# Patient Record
Sex: Male | Born: 1960 | Race: Black or African American | Hispanic: No | Marital: Single | Smoking: Current some day smoker
Health system: Southern US, Community
[De-identification: ages and names within clinical notes are randomized; demographics above are authoritative.]

## PROBLEM LIST (undated history)

## (undated) DIAGNOSIS — I10 Essential (primary) hypertension: Secondary | ICD-10-CM

---

## 2003-06-12 ENCOUNTER — Emergency Department (HOSPITAL_COMMUNITY): Admission: EM | Admit: 2003-06-12 | Discharge: 2003-06-12 | Payer: Self-pay | Admitting: Emergency Medicine

## 2004-09-15 ENCOUNTER — Emergency Department (HOSPITAL_COMMUNITY): Admission: EM | Admit: 2004-09-15 | Discharge: 2004-09-15 | Payer: Self-pay | Admitting: Emergency Medicine

## 2004-12-09 ENCOUNTER — Emergency Department (HOSPITAL_COMMUNITY): Admission: EM | Admit: 2004-12-09 | Discharge: 2004-12-09 | Payer: Self-pay | Admitting: Emergency Medicine

## 2004-12-12 ENCOUNTER — Emergency Department (HOSPITAL_COMMUNITY): Admission: EM | Admit: 2004-12-12 | Discharge: 2004-12-12 | Payer: Self-pay | Admitting: Emergency Medicine

## 2005-01-02 ENCOUNTER — Emergency Department (HOSPITAL_COMMUNITY): Admission: EM | Admit: 2005-01-02 | Discharge: 2005-01-02 | Payer: Self-pay | Admitting: Emergency Medicine

## 2005-07-05 ENCOUNTER — Emergency Department (HOSPITAL_COMMUNITY): Admission: EM | Admit: 2005-07-05 | Discharge: 2005-07-05 | Payer: Self-pay | Admitting: Emergency Medicine

## 2012-06-05 ENCOUNTER — Emergency Department (HOSPITAL_COMMUNITY): Payer: Self-pay

## 2012-06-05 ENCOUNTER — Encounter (HOSPITAL_COMMUNITY): Payer: Self-pay | Admitting: Emergency Medicine

## 2012-06-05 ENCOUNTER — Emergency Department (HOSPITAL_COMMUNITY)
Admission: EM | Admit: 2012-06-05 | Discharge: 2012-06-05 | Disposition: A | Discharge status: Home / Self Care | Payer: Self-pay | Attending: Emergency Medicine | Admitting: Emergency Medicine

## 2012-06-05 DIAGNOSIS — S060X9A Concussion with loss of consciousness of unspecified duration, initial encounter: Secondary | ICD-10-CM | POA: Insufficient documentation

## 2012-06-05 DIAGNOSIS — Y9389 Activity, other specified: Secondary | ICD-10-CM | POA: Insufficient documentation

## 2012-06-05 DIAGNOSIS — M25512 Pain in left shoulder: Secondary | ICD-10-CM

## 2012-06-05 DIAGNOSIS — S0993XA Unspecified injury of face, initial encounter: Secondary | ICD-10-CM | POA: Insufficient documentation

## 2012-06-05 DIAGNOSIS — Y929 Unspecified place or not applicable: Secondary | ICD-10-CM | POA: Insufficient documentation

## 2012-06-05 DIAGNOSIS — S199XXA Unspecified injury of neck, initial encounter: Secondary | ICD-10-CM | POA: Insufficient documentation

## 2012-06-05 DIAGNOSIS — F17200 Nicotine dependence, unspecified, uncomplicated: Secondary | ICD-10-CM | POA: Insufficient documentation

## 2012-06-05 DIAGNOSIS — S0083XA Contusion of other part of head, initial encounter: Secondary | ICD-10-CM | POA: Insufficient documentation

## 2012-06-05 DIAGNOSIS — S0003XA Contusion of scalp, initial encounter: Secondary | ICD-10-CM | POA: Insufficient documentation

## 2012-06-05 DIAGNOSIS — W11XXXA Fall on and from ladder, initial encounter: Secondary | ICD-10-CM | POA: Insufficient documentation

## 2012-06-05 DIAGNOSIS — S46009A Unspecified injury of muscle(s) and tendon(s) of the rotator cuff of unspecified shoulder, initial encounter: Secondary | ICD-10-CM

## 2012-06-05 DIAGNOSIS — S0093XA Contusion of unspecified part of head, initial encounter: Secondary | ICD-10-CM

## 2012-06-05 MED ORDER — HYDROMORPHONE HCL PF 1 MG/ML IJ SOLN
1.0000 mg | Freq: Once | INTRAMUSCULAR | Status: AC
  Administered 2012-06-05: 1 mg via INTRAVENOUS
  Filled 2012-06-05: qty 1

## 2012-06-05 MED ORDER — HYDROCODONE-ACETAMINOPHEN 5-325 MG PO TABS: ORAL_TABLET | ORAL | Status: DC

## 2012-06-05 MED ORDER — ONDANSETRON HCL 4 MG/2ML IJ SOLN
4.0000 mg | Freq: Once | INTRAMUSCULAR | Status: AC
  Administered 2012-06-05: 4 mg via INTRAVENOUS
  Filled 2012-06-05: qty 2

## 2012-06-05 MED ORDER — CYCLOBENZAPRINE HCL 5 MG PO TABS: 5.0000 mg | ORAL_TABLET | Freq: Three times a day (TID) | ORAL | Status: DC | PRN

## 2012-06-05 NOTE — ED Notes (Signed)
Patient would like some water at this time. RN made aware. 

## 2012-06-05 NOTE — ED Notes (Signed)
Pt fell aprox 8 steps from ladder. Pt states did have LOC. And c/o L shoulder pain. L shoulder obviously deformed. Denies n/v or h/a since fall but has had some dizziness. Radial pulses strong.

## 2012-06-05 NOTE — ED Provider Notes (Signed)
History   This chart was scribed for Ward Givens, MD by Charolett Bumpers, ED Scribe. The patient was seen in room APA02/APA02. Patient's care was started at 1135.   CSN: [id number]  Arrival date & time 06/05/12  1100   First MD Initiated Contact with Patient 06/05/12 1135      Chief Complaint  Patient presents with  . Shoulder Pain  . Loss of Consciousness   Blake Valentine is a 51 y.o. male who presents to the Emergency Department complaining of left shoulder pain with an obvious deformity after falling approximately 8 steps from a ladder PTA. He was carrying shingles up a ladder and lost his balance and fell.He states that he twisted while falling, and landed on his left shoulder. He reports he hit his head and experienced LOC for uncertain length of time. He states that he has some confusion temporarily while he first came to. He reports an associated occipital headache and intermittent numbness in his left fingers. He denies any nausea, vomiting,chest or rib pain,abdominal pain, back pain lower extremity pain. He denies any other injuries or complaints of pain. He states that he was able to ambulate afterwards. He denies any regular medications and states his is normally healthy.   Patient is a 51 y.o. male presenting with shoulder pain. The history is provided by the patient. No language interpreter was used.  Shoulder Pain This is a new problem. The current episode started 1 to 2 hours ago. The problem occurs constantly. The problem has not changed since onset.Associated symptoms include headaches. Pertinent negatives include no chest pain. He has tried nothing for the symptoms.   No PCP  History reviewed. No pertinent past medical history.  History reviewed. No pertinent past surgical history.  History reviewed. No pertinent family history.    History  Substance Use Topics  . Smoking status: Current Every Day Smoker  . Smokeless tobacco: Not on file  . Alcohol Use: No    Employed as a roofer   Review of Systems  HENT: Negative for neck pain.   Cardiovascular: Negative for chest pain.  Gastrointestinal: Negative for nausea and vomiting.  Musculoskeletal: Positive for arthralgias.       Left shoulder pain.   Neurological: Positive for headaches.  All other systems reviewed and are negative.    Allergies  Review of patient's allergies indicates no known allergies.  Home Medications  None  BP 112/65  Pulse 81  Temp 98.1 F (36.7 C) (Oral)  Resp 16  Ht 5\' 6"  (1.676 m)  Wt 150 lb (68.04 kg)  BMI 24.21 kg/m2  SpO2 98%  Vital signs normal    Physical Exam  Nursing note and vitals reviewed. Constitutional: He is oriented to person, place, and time. He appears well-developed and well-nourished. No distress.  HENT:  Head: Normocephalic and atraumatic.  Right Ear: External ear normal.  Left Ear: External ear normal.  Nose: Nose normal.  Mouth/Throat: Oropharynx is clear and moist.       Tender over the occipital scalp.   Eyes: Conjunctivae normal and EOM are normal. Pupils are equal, round, and reactive to light.  Neck: Normal range of motion. Neck supple. No tracheal deviation present.       Patient placed in c-collar, has some tenderness in his left paraspinous muscles when patient moves his head.  Cardiovascular: Normal rate and regular rhythm.  Exam reveals no gallop and no friction rub.   No murmur heard. Pulmonary/Chest: Effort normal and  breath sounds normal. No respiratory distress. He has no wheezes. He has no rales. He exhibits no tenderness.       nontender ribs to stressing  Abdominal: Soft. Bowel sounds are normal. He exhibits no distension and no mass. There is no tenderness. There is no rebound and no guarding.       No tenderness to palpation  Musculoskeletal: Normal range of motion. He exhibits no edema.       Patient is nontender to palpation over his left clavicle. He appears to have deformity with step off in his left  shoulder area. That is also where his pain is located. He has good distal sensation and has good range of motion of his fingers. He has good distal pulses. He has good range of motion of his lower extremities without pain on  flexion of the hips or knees.  Neurological: He is alert and oriented to person, place, and time.  Skin: Skin is warm and dry.  Psychiatric: He has a normal mood and affect. His behavior is normal.    ED Course  Procedures (including critical care time)   Medications  HYDROmorphone (DILAUDID) injection 1 mg (1 mg Intravenous Given 06/05/12 1205)  ondansetron (ZOFRAN) injection 4 mg (4 mg Intravenous Given 06/05/12 1205)     DIAGNOSTIC STUDIES: Oxygen Saturation is 98% on room air, normal by my interpretation.    COORDINATION OF CARE:  11:55-Discussed planned course of treatment with the patient including pain medication, an x-ray of his left shoulder and a head CT and c-spine, who is agreeable at this time.   14:15 pt states he injured his left shoulder about 4 years ago and was told it was dislocated but not broken. Still unable to abduct his left arm. Pt placed in sling for possible roatator cuff injury.   Ct Head Wo Contrast  06/05/2012  *RADIOLOGY REPORT*  Clinical Data: Fall from ladder  CT HEAD WITHOUT CONTRAST,CT CERVICAL SPINE WITHOUT CONTRAST  Technique:  Contiguous axial images were obtained from the base of the skull through the vertex without contrast.,Technique: Multidetector CT imaging of the cervical spine was performed. Multiplanar CT image reconstructions were also generated.  Comparison: None.  Findings: No skull fracture is noted.  Paranasal sinuses and mastoid air cells are unremarkable.  No intracranial hemorrhage, mass effect or midline shift.  No hydrocephalus.  No acute infarction.  No mass lesion is noted on this unenhanced scan.  The gray and white matter differentiation is preserved.  IMPRESSION: No acute intracranial abnormality.  CT cervical  spine without IV contrast:  Axial images of the cervical spine shows no acute fracture or subluxation.  Computer processed images shows no acute fracture or subluxation.  There is no pneumothorax in visualized lung apices.  Mild anterior spurring noted lower endplate of C5 vertebral body. Mild disc bulge noted at C5-C6 level.  Mild disc space flattening with disc bulge at C4-C5 level.  There is calcification of the anterior longitudinal ligament at C5-C6 level.  No prevertebral soft tissue swelling.  Spinal canal is patent.  Impression: 1.  No acute fracture or subluxation.  Mild degenerative changes as described above.   Original Report Authenticated By: Natasha Mead, M.D.    Ct Cervical Spine Wo Contrast  06/05/2012  *RADIOLOGY REPORT*  Clinical Data: Fall from ladder  CT HEAD WITHOUT CONTRAST,CT CERVICAL SPINE WITHOUT CONTRAST  Technique:  Contiguous axial images were obtained from the base of the skull through the vertex without contrast.,Technique: Multidetector CT imaging of  the cervical spine was performed. Multiplanar CT image reconstructions were also generated.  Comparison: None.  Findings: No skull fracture is noted.  Paranasal sinuses and mastoid air cells are unremarkable.  No intracranial hemorrhage, mass effect or midline shift.  No hydrocephalus.  No acute infarction.  No mass lesion is noted on this unenhanced scan.  The gray and white matter differentiation is preserved.  IMPRESSION: No acute intracranial abnormality.  CT cervical spine without IV contrast:  Axial images of the cervical spine shows no acute fracture or subluxation.  Computer processed images shows no acute fracture or subluxation.  There is no pneumothorax in visualized lung apices.  Mild anterior spurring noted lower endplate of C5 vertebral body. Mild disc bulge noted at C5-C6 level.  Mild disc space flattening with disc bulge at C4-C5 level.  There is calcification of the anterior longitudinal ligament at C5-C6 level.  No  prevertebral soft tissue swelling.  Spinal canal is patent.  Impression: 1.  No acute fracture or subluxation.  Mild degenerative changes as described above.   Original Report Authenticated By: Natasha Mead, M.D.    Dg Shoulder Left  06/05/2012  *RADIOLOGY REPORT*  Clinical Data: Left shoulder pain, fall from ladder  LEFT SHOULDER - 2+ VIEW  Comparison: None  Findings: Abnormal alignment of left AC joint with superior subluxation of left clavicular head. Associated periosteal new bone along the caudal margin of the distal clavicle. These changes are likely the result of prior trauma, question AC separation with periosteal stripping versus prior distal left clavicular fracture. No acute fracture, dislocation, or bone destruction. Bone mineralization is normal. Visualized left ribs intact.  IMPRESSION: Post-traumatic deformities of the distal left clavicle and left AC joint. No definite acute bony abnormalities.   Original Report Authenticated By: Ulyses Southward, M.D.      1. Accidental fall from ladder   2. Concussion   3. Shoulder pain, left   4. Rotator cuff injury   5. Contusion of head    New Prescriptions   CYCLOBENZAPRINE (FLEXERIL) 5 MG TABLET    Take 1 tablet (5 mg total) by mouth 3 (three) times daily as needed for muscle spasms.   HYDROCODONE-ACETAMINOPHEN (NORCO/VICODIN) 5-325 MG PER TABLET    Take 1 or 2 po Q 6hrs for pain    Plan discharge  Devoria Albe, MD, FACEP    MDM    I personally performed the services described in this documentation, which was scribed in my presence. The recorded information has been reviewed and considered.  Devoria Albe, MD, Armando Gang      Ward Givens, MD 06/05/12 [phone]

## 2012-06-05 NOTE — ED Notes (Signed)
Patient given water per MD approval. 

## 2012-06-12 ENCOUNTER — Encounter (HOSPITAL_COMMUNITY): Payer: Self-pay | Admitting: Emergency Medicine

## 2012-06-12 ENCOUNTER — Telehealth: Payer: Self-pay | Admitting: Orthopedic Surgery

## 2012-06-12 ENCOUNTER — Emergency Department (HOSPITAL_COMMUNITY)
Admission: EM | Admit: 2012-06-12 | Discharge: 2012-06-12 | Disposition: A | Discharge status: Home / Self Care | Payer: Self-pay | Attending: Emergency Medicine | Admitting: Emergency Medicine

## 2012-06-12 DIAGNOSIS — F17200 Nicotine dependence, unspecified, uncomplicated: Secondary | ICD-10-CM | POA: Insufficient documentation

## 2012-06-12 DIAGNOSIS — M25519 Pain in unspecified shoulder: Secondary | ICD-10-CM | POA: Insufficient documentation

## 2012-06-12 MED ORDER — OXYCODONE-ACETAMINOPHEN 5-325 MG PO TABS: 1.0000 | ORAL_TABLET | ORAL | Status: DC | PRN

## 2012-06-12 MED ORDER — OXYCODONE-ACETAMINOPHEN 5-325 MG PO TABS
2.0000 | ORAL_TABLET | Freq: Once | ORAL | Status: AC
  Administered 2012-06-12: 2 via ORAL
  Filled 2012-06-12: qty 2

## 2012-06-12 NOTE — ED Notes (Signed)
Pt here for continued shoulder pain since 06/05/12. Pt seen and treated in ED after falling from roof. Pt states he could not get an appointment with Dr. Romeo Apple.

## 2012-06-12 NOTE — Telephone Encounter (Signed)
Patient called back; states already had been back to Emergency room again.  Advised we had been trying to reach him. Appointment scheduled for next available date.

## 2012-06-12 NOTE — Telephone Encounter (Addendum)
Patient had called initially on 06/06/12 following visit to [facility]  Emergency Room on 06/05/12, regarding shoulder injury (dislocation) related to a fall.  Per note submitted to Dr. Romeo Apple, he has reviewed the patient's notes and films, and has recommended follow up appointment here, Self-pay policy applies, as initially explained to patient 06/06/12.   I called back to patient today 06/12/12 to follow up, as no call back has been received, ph# [phone].  No answer or answer machine. Tried cell # [phone], and it is a non-working #.

## 2012-06-15 NOTE — ED Provider Notes (Signed)
History     CSN: [id number]  Arrival date & time 06/12/12  1122   First MD Initiated Contact with Patient 06/12/12 1245      Chief Complaint  Patient presents with  . Shoulder Pain    (Consider location/radiation/quality/duration/timing/severity/associated sxs/prior treatment) HPI Comments: Blake Valentine presents for continued pain management for injury sustained to his left shoulder after falling off a roof 1 week ago.  He was seen here for this injury the day of the event at which time his xrays showed no new injury,  Yet he continues to have pain,  Despite resting with use of a sling.  He is awaiting further evaluation of his shoulder by Dr. Romeo Apple and is waiting a call back for an appointment time.  He denies numbness or weakness in his hand. He also denies shortness of breath and chest pain. He took his last pain pill yesterday.  The history is provided by the patient.    History reviewed. No pertinent past medical history.  History reviewed. No pertinent past surgical history.  No family history on file.  History  Substance Use Topics  . Smoking status: Current Every Day Smoker  . Smokeless tobacco: Not on file  . Alcohol Use: No      Review of Systems  Musculoskeletal: Positive for arthralgias.  Skin: Negative for wound.  Neurological: Negative for weakness and numbness.    Allergies  Review of patient's allergies indicates no known allergies.  Home Medications   Current Outpatient Rx  Name  Route  Sig  Dispense  Refill  . CYCLOBENZAPRINE HCL 5 MG PO TABS   Oral   Take 1 tablet (5 mg total) by mouth 3 (three) times daily as needed for muscle spasms.   30 tablet   0   . HYDROCODONE-ACETAMINOPHEN 5-325 MG PO TABS      Take 1 or 2 po Q 6hrs for pain   12 tablet   0   . OXYCODONE-ACETAMINOPHEN 5-325 MG PO TABS   Oral   Take 1 tablet by mouth every 4 (four) hours as needed for pain.   24 tablet   0     BP 128/93  Pulse 82  Temp 98.4 F  (36.9 C) (Oral)  Resp 20  Ht 5\' 6"  (1.676 m)  Wt 150 lb (68.04 kg)  BMI 24.21 kg/m2  SpO2 98%  Physical Exam  Constitutional: He appears well-developed and well-nourished.  HENT:  Head: Atraumatic.  Neck: Normal range of motion.  Cardiovascular:       Pulses equal bilaterally  Musculoskeletal: He exhibits tenderness.       Left shoulder: He exhibits bony tenderness and deformity.       Palpable step off noted at left [facility] joint.  Equal grip strength. Distal sensation intact in bilateral arms and hands.  Radial pulses equal.  No pain with flexion of left wrist or elbow.  Neurological: He is alert. He has normal strength. He displays normal reflexes. No sensory deficit.       Equal strength  Skin: Skin is warm and dry.  Psychiatric: He has a normal mood and affect.    ED Course  Procedures (including critical care time)  Labs Reviewed - No data to display No results found.   1. Shoulder pain, acute       MDM  Pt advised that Dr. Diamantina [facility] office had tried to call him, apparently have incorrect phone # - advised him to contact them to schedule appt.  Pt understands.  Oxycodone prescribed.  Advised to continue using sling.        Blake Amor, PA 06/15/12 1121

## 2012-06-15 NOTE — ED Provider Notes (Signed)
Medical screening examination/treatment/procedure(s) were performed by non-physician practitioner and as supervising physician I was immediately available for consultation/collaboration.   [name] M Hallel Denherder, DO 06/15/12 1310 

## 2012-06-19 ENCOUNTER — Encounter: Payer: Self-pay | Admitting: Orthopedic Surgery

## 2012-06-19 ENCOUNTER — Ambulatory Visit (INDEPENDENT_AMBULATORY_CARE_PROVIDER_SITE_OTHER): Payer: Self-pay | Admitting: Orthopedic Surgery

## 2012-06-19 VITALS — BP 110/74 | Ht 66.0 in | Wt 162.6 lb

## 2012-06-19 DIAGNOSIS — S43109A Unspecified dislocation of unspecified acromioclavicular joint, initial encounter: Secondary | ICD-10-CM | POA: Insufficient documentation

## 2012-06-19 MED ORDER — OXYCODONE-ACETAMINOPHEN 5-325 MG PO TABS: 1.0000 | ORAL_TABLET | ORAL | Status: AC | PRN

## 2012-06-19 NOTE — Patient Instructions (Addendum)
Wear sling for 3 weeks   Remove to bathe   Limit elevation x 3 weeks  Take medication  As ordered

## 2012-06-20 ENCOUNTER — Encounter: Payer: Self-pay | Admitting: Orthopedic Surgery

## 2012-06-20 NOTE — Progress Notes (Signed)
Patient ID: Blake Valentine, male   DOB: 15-Jan-1961, 51 y.o.   MRN: [id number] Chief Complaint  Patient presents with  . Shoulder Pain    Rotator cuff pain d/t injury 06/09/12      This is a 51 year old male previous shoulder injury many years ago with the a.c. joint calcification which developed after a.c. separation presents after falling off of a height on December 11 injuring the left shoulder again there was question of whether she reentered the a.c. joint or had a rotator cuff injury.  He is now complaining of sharp dull throbbing burning pain which is rated 10 out of 10 and constant. It occurs morning and night is better with pain medication of Percocet but is worse at night and has some numbness and tingling. Apparently the roof was 2 stories high and when he fell he fell onto the shoulder. The system review is consistent with the chills and joint pain with the numbness and tingling as described no other complaints  Medical history recorded and reviewed  BP 110/74  Ht 5\' 6"  (1.676 m)  Wt 162 lb 9.6 oz (73.755 kg)  BMI 26.24 kg/m2   Examination is notable for tenderness over the a.c. joint with prominence of the a.c. joint which is chronic he has decreased range of motion in the shoulder but the shoulder joint itself is stable his decreased rotator cuff strength I think this is pain related skin is otherwise intact he has good pulses no lymphadenopathy normal distal sensation no pathologic reflexes normal balance normal gait. His mood and affect are normal joint x3 general appearance is also normal  A.c. joint injury. Recommend sling for 3 weeks pain medicine for 3 weeks if he's not better after 6 weeks then recommend followup evaluation but he is a self-pay patient and we have both agreed to try to cross so if he is better after a few weeks then he will need to come back. 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

## 2015-04-29 ENCOUNTER — Encounter (HOSPITAL_COMMUNITY): Payer: Self-pay | Admitting: Emergency Medicine

## 2015-04-29 ENCOUNTER — Emergency Department (HOSPITAL_COMMUNITY)
Admission: EM | Admit: 2015-04-29 | Discharge: 2015-04-29 | Disposition: A | Discharge status: Home / Self Care | Payer: Self-pay | Attending: Emergency Medicine | Admitting: Emergency Medicine

## 2015-04-29 ENCOUNTER — Emergency Department (HOSPITAL_COMMUNITY): Payer: Self-pay

## 2015-04-29 DIAGNOSIS — R197 Diarrhea, unspecified: Secondary | ICD-10-CM | POA: Insufficient documentation

## 2015-04-29 DIAGNOSIS — Z720 Tobacco use: Secondary | ICD-10-CM | POA: Insufficient documentation

## 2015-04-29 DIAGNOSIS — R1031 Right lower quadrant pain: Secondary | ICD-10-CM | POA: Insufficient documentation

## 2015-04-29 DIAGNOSIS — R109 Unspecified abdominal pain: Secondary | ICD-10-CM

## 2015-04-29 DIAGNOSIS — R112 Nausea with vomiting, unspecified: Secondary | ICD-10-CM | POA: Insufficient documentation

## 2015-04-29 DIAGNOSIS — R1011 Right upper quadrant pain: Secondary | ICD-10-CM | POA: Insufficient documentation

## 2015-04-29 LAB — COMPREHENSIVE METABOLIC PANEL
ALT: 32 U/L (ref 17–63)
AST: 23 U/L (ref 15–41)
Albumin: 3.8 g/dL (ref 3.5–5.0)
Alkaline Phosphatase: 53 U/L (ref 38–126)
Anion gap: 6 (ref 5–15)
BUN: 12 mg/dL (ref 6–20)
CO2: 26 mmol/L (ref 22–32)
Calcium: 8.9 mg/dL (ref 8.9–10.3)
Chloride: 107 mmol/L (ref 101–111)
Creatinine, Ser: 0.95 mg/dL (ref 0.61–1.24)
GFR calc Af Amer: >60 mL/min (ref >60)
GFR calc non Af Amer: >60 mL/min (ref >60)
Glucose, Bld: 86 mg/dL (ref 65–99)
Potassium: 3.4 mmol/L — ABNORMAL LOW (ref 3.5–5.1)
Sodium: 139 mmol/L (ref 135–145)
Total Bilirubin: 0.9 mg/dL (ref 0.3–1.2)
Total Protein: 7.4 g/dL (ref 6.5–8.1)

## 2015-04-29 LAB — URINALYSIS, ROUTINE W REFLEX MICROSCOPIC
Bilirubin Urine: NEGATIVE
Glucose, UA: NEGATIVE mg/dL
Ketones, ur: NEGATIVE mg/dL
Leukocytes, UA: NEGATIVE
Nitrite: NEGATIVE
Protein, ur: NEGATIVE mg/dL
Specific Gravity, Urine: 1.015 (ref 1.005–1.030)
Urobilinogen, UA: 0.2 mg/dL (ref 0.0–1.0)
pH: 6 (ref 5.0–8.0)

## 2015-04-29 LAB — URINE MICROSCOPIC-ADD ON

## 2015-04-29 LAB — CBC
HCT: 42.3 % (ref 39.0–52.0)
Hemoglobin: 14.6 g/dL (ref 13.0–17.0)
MCH: 31.2 pg (ref 26.0–34.0)
MCHC: 34.5 g/dL (ref 30.0–36.0)
MCV: 90.4 fL (ref 78.0–100.0)
Platelets: 256 10*3/uL (ref 150–400)
RBC: 4.68 MIL/uL (ref 4.22–5.81)
RDW: 14 % (ref 11.5–15.5)
WBC: 9.5 10*3/uL (ref 4.0–10.5)

## 2015-04-29 LAB — LIPASE, BLOOD: Lipase: 26 U/L (ref 11–51)

## 2015-04-29 MED ORDER — TRAMADOL HCL 50 MG PO TABS: 50.0000 mg | ORAL_TABLET | Freq: Four times a day (QID) | ORAL | Status: DC | PRN

## 2015-04-29 MED ORDER — SODIUM CHLORIDE 0.9 % IV BOLUS (SEPSIS)
1000.0000 mL | Freq: Once | INTRAVENOUS | Status: AC
  Administered 2015-04-29: 1000 mL via INTRAVENOUS

## 2015-04-29 MED ORDER — IOHEXOL 300 MG/ML  SOLN
50.0000 mL | Freq: Once | INTRAMUSCULAR | Status: AC | PRN
  Administered 2015-04-29: 50 mL via ORAL

## 2015-04-29 MED ORDER — KETOROLAC TROMETHAMINE 30 MG/ML IJ SOLN
15.0000 mg | Freq: Once | INTRAMUSCULAR | Status: AC
  Administered 2015-04-29: 15 mg via INTRAVENOUS
  Filled 2015-04-29: qty 1

## 2015-04-29 MED ORDER — IOHEXOL 300 MG/ML  SOLN
100.0000 mL | Freq: Once | INTRAMUSCULAR | Status: AC | PRN
  Administered 2015-04-29: 100 mL via INTRAVENOUS

## 2015-04-29 MED ORDER — ONDANSETRON HCL 4 MG/2ML IJ SOLN
4.0000 mg | Freq: Once | INTRAMUSCULAR | Status: AC
  Administered 2015-04-29: 4 mg via INTRAVENOUS
  Filled 2015-04-29: qty 2

## 2015-04-29 MED ORDER — HYDROMORPHONE HCL 1 MG/ML IJ SOLN
1.0000 mg | Freq: Once | INTRAMUSCULAR | Status: AC
  Administered 2015-04-29: 1 mg via INTRAVENOUS
  Filled 2015-04-29: qty 1

## 2015-04-29 NOTE — Discharge Instructions (Signed)

## 2015-04-29 NOTE — ED Notes (Signed)
Pt c/o of LLQ abdominal pain, n/v/d x 3 days.

## 2015-04-29 NOTE — ED Provider Notes (Signed)
CSN: [id number]     Arrival date & time 04/29/15  1118 History  By signing my name below, I, Blake Valentine, attest that this documentation has been prepared under the direction and in the presence of Raeford RazorStephen Jerrin Recore, MD. Electronically Signed: Angelene GiovanniEmmanuella Valentine, ED Scribe. 04/29/2015. 11:51 AM.    Chief Complaint  Patient presents with  . Abdominal Pain   Patient is a 54 y.o. male presenting with abdominal pain. The history is provided by the patient. No language interpreter was used.  Abdominal Pain Pain location:  RUQ and RLQ Pain quality: burning and sharp   Pain radiates to:  Back Pain severity:  Moderate Onset quality:  Gradual Duration:  3 days Timing:  Constant Progression:  Worsening Chronicity:  New Relieved by:  Nothing Worsened by:  Movement Ineffective treatments:  NSAIDs Associated symptoms: diarrhea, nausea and vomiting   Associated symptoms: no hematuria    HPI Comments: Mendel CorningWendell V Higdon is a 54 y.o. male who presents to the Emergency Department complaining of gradually worsening moderately sharp burning constant right abdominal pain that intermittently radiates to his right back worse with movement onset 3 days ago. He reports associated nausea, vomiting and diarrhea. He denies any blood in stool or hematuria. He reports that he took Ibuprofen with no relief. He denies a hx of abdominal surgeries. He reports NKDA.   History reviewed. No pertinent past medical history. History reviewed. No pertinent past surgical history. No family history on file. Social History  Substance Use Topics  . Smoking status: Current Every Day Smoker -- 1.00 packs/day    Types: Cigarettes  . Smokeless tobacco: None  . Alcohol Use: Yes     Comment: occ    Review of Systems  Gastrointestinal: Positive for nausea, vomiting, abdominal pain and diarrhea. Negative for blood in stool.  Genitourinary: Negative for hematuria.  All other systems reviewed and are  negative.     Allergies  Review of patient's allergies indicates no known allergies.  Home Medications   Prior to Admission medications   Not on File   BP 155/93 mmHg  Pulse 66  Temp(Src) 97.5 F (36.4 C) (Oral)  Resp 16  Ht 5\' 7"  (1.702 m)  Wt 180 lb (81.647 kg)  BMI 28.19 kg/m2  SpO2 98% Physical Exam  Constitutional: He is oriented to person, place, and time. He appears well-developed and well-nourished.  HENT:  Head: Normocephalic and atraumatic.  Eyes: EOM are normal.  Neck: Normal range of motion.  Cardiovascular: Normal rate, regular rhythm, normal heart sounds and intact distal pulses.   Pulmonary/Chest: Effort normal and breath sounds normal. No respiratory distress.  Abdominal: Soft. He exhibits no distension. There is tenderness. There is guarding. There is no rebound.  Tender in RUQ and RLQ with voluntary guarding in RUQ/RLQ  Musculoskeletal: Normal range of motion.  Neurological: He is alert and oriented to person, place, and time.  Skin: Skin is warm and dry.  Psychiatric: He has a normal mood and affect. Judgment normal.  Nursing note and vitals reviewed.   ED Course  Procedures (including critical care time) DIAGNOSTIC STUDIES: Oxygen Saturation is 96% on RA, adequate by my interpretation.    COORDINATION OF CARE: 11:46 AM - Pt advised of plan for treatment and pt agrees. Pt will receive CAT scan to rule out appendicitis.    Labs Review Labs Reviewed  COMPREHENSIVE METABOLIC PANEL - Abnormal; Notable for the following:    Potassium 3.4 (*)    All other components within normal  limits  URINALYSIS, ROUTINE W REFLEX MICROSCOPIC (NOT AT [facility]) - Abnormal; Notable for the following:    Hgb urine dipstick TRACE (*)    All other components within normal limits  LIPASE, BLOOD  CBC  URINE MICROSCOPIC-ADD ON    Imaging Review No results found.   Ct Abdomen Pelvis W Contrast  05/07/2015  CLINICAL DATA:  Right lower quadrant abdominal pain, diarrhea  and dark stools for the past 3 days. EXAM: CT ABDOMEN AND PELVIS WITH CONTRAST TECHNIQUE: Multidetector CT imaging of the abdomen and pelvis was performed using the standard protocol following bolus administration of intravenous contrast. CONTRAST:  OMNIPAQUE IOHEXOL 300 MG/ML  SOLN COMPARISON:  04/29/2015. FINDINGS: Small cysts in the liver are unchanged. The spleen, pancreas, gallbladder, adrenal glands, kidneys, ureters, urinary bladder and prostate gland appear normal. No gastrointestinal abnormalities or enlarged lymph nodes. Normal appearing appendix. Small amount of linear atelectasis or scarring at both lung bases. Mild dextroconvex thoracolumbar scoliosis. Lumbar spine degenerative changes. IMPRESSION: No acute abnormality. Electronically Signed   By: Beckie Salts M.D.   On: 05/07/2015 16:30   Ct Abdomen Pelvis W Contrast  04/29/2015  CLINICAL DATA:  Right lower quadrant pain and tenderness with nausea, vomiting, and diarrhea for 3 days. EXAM: CT ABDOMEN AND PELVIS WITH CONTRAST TECHNIQUE: Multidetector CT imaging of the abdomen and pelvis was performed using the standard protocol following bolus administration of intravenous contrast. CONTRAST:  100 mL Omnipaque 300 IV COMPARISON:  None. FINDINGS: Minimal atelectasis is noted in the lung bases. A 1.1 cm low-density lesion in the hepatic dome likely represents a cyst. A subcentimeter hypodense lesion more inferiorly in the right hepatic lobe is too small to characterize. The gallbladder, spleen, adrenal glands, and pancreas are unremarkable. Excreted contrast material is present in the renal collecting systems, ureters, and bladder. No hydronephrosis, renal mass, or sizable renal calculi are identified within this limitation. Oral contrast is present in multiple nondilated loops of small and large bowel to the level of the transverse colon without evidence of obstruction. No gross bowel wall thickening or inflammatory changes are seen. Appendix is  identified in the right lower quadrant and is unremarkable. No free fluid or enlarged lymph nodes are identified. Abdominal aorta is normal in caliber. Mild disc degeneration is noted at L5-S1. IMPRESSION: No acute abnormality identified in the abdomen and pelvis. Electronically Signed   By: Sebastian Ache M.D.   On: 04/29/2015 13:46  s personally reviewed and evaluated these images and lab results as part of his medical decision-making.   EKG Interpretation None      MDM   Final diagnoses:  Right sided abdominal pain   I personally preformed the services scribed in my presence. The recorded information has been reviewed is accurate. Raeford Razor, MD.    Raeford Razor, MD 05/09/15 [phone]

## 2015-05-07 ENCOUNTER — Emergency Department (HOSPITAL_COMMUNITY)
Admission: EM | Admit: 2015-05-07 | Discharge: 2015-05-07 | Disposition: A | Discharge status: Home / Self Care | Payer: Self-pay | Attending: Emergency Medicine | Admitting: Emergency Medicine

## 2015-05-07 ENCOUNTER — Encounter (HOSPITAL_COMMUNITY): Payer: Self-pay | Admitting: Emergency Medicine

## 2015-05-07 ENCOUNTER — Emergency Department (HOSPITAL_COMMUNITY): Payer: Self-pay

## 2015-05-07 DIAGNOSIS — K625 Hemorrhage of anus and rectum: Secondary | ICD-10-CM | POA: Insufficient documentation

## 2015-05-07 DIAGNOSIS — R1031 Right lower quadrant pain: Secondary | ICD-10-CM | POA: Insufficient documentation

## 2015-05-07 DIAGNOSIS — Z720 Tobacco use: Secondary | ICD-10-CM | POA: Insufficient documentation

## 2015-05-07 DIAGNOSIS — R109 Unspecified abdominal pain: Secondary | ICD-10-CM

## 2015-05-07 DIAGNOSIS — R197 Diarrhea, unspecified: Secondary | ICD-10-CM | POA: Insufficient documentation

## 2015-05-07 LAB — URINE MICROSCOPIC-ADD ON

## 2015-05-07 LAB — URINALYSIS, ROUTINE W REFLEX MICROSCOPIC
Bilirubin Urine: NEGATIVE
Glucose, UA: NEGATIVE mg/dL
Leukocytes, UA: NEGATIVE
Nitrite: NEGATIVE
Protein, ur: NEGATIVE mg/dL
Specific Gravity, Urine: 1.02 (ref 1.005–1.030)
Urobilinogen, UA: 1 mg/dL (ref 0.0–1.0)
pH: 6.5 (ref 5.0–8.0)

## 2015-05-07 LAB — COMPREHENSIVE METABOLIC PANEL
ALT: 36 U/L (ref 17–63)
AST: 25 U/L (ref 15–41)
Albumin: 3.9 g/dL (ref 3.5–5.0)
Alkaline Phosphatase: 64 U/L (ref 38–126)
Anion gap: 6 (ref 5–15)
BUN: 16 mg/dL (ref 6–20)
CO2: 24 mmol/L (ref 22–32)
Calcium: 9.2 mg/dL (ref 8.9–10.3)
Chloride: 109 mmol/L (ref 101–111)
Creatinine, Ser: 1.03 mg/dL (ref 0.61–1.24)
GFR calc Af Amer: >60 mL/min (ref >60)
GFR calc non Af Amer: >60 mL/min (ref >60)
Glucose, Bld: 119 mg/dL — ABNORMAL HIGH (ref 65–99)
Potassium: 3.3 mmol/L — ABNORMAL LOW (ref 3.5–5.1)
Sodium: 139 mmol/L (ref 135–145)
Total Bilirubin: 0.7 mg/dL (ref 0.3–1.2)
Total Protein: 7.7 g/dL (ref 6.5–8.1)

## 2015-05-07 LAB — CBC WITH DIFFERENTIAL/PLATELET
Basophils Absolute: 0 10*3/uL (ref 0.0–0.1)
Basophils Relative: 0 %
Eosinophils Absolute: 0.2 10*3/uL (ref 0.0–0.7)
Eosinophils Relative: 2 %
HCT: 45.8 % (ref 39.0–52.0)
Hemoglobin: 15.9 g/dL (ref 13.0–17.0)
Lymphocytes Relative: 31 %
Lymphs Abs: 2.9 10*3/uL (ref 0.7–4.0)
MCH: 31.4 pg (ref 26.0–34.0)
MCHC: 34.7 g/dL (ref 30.0–36.0)
MCV: 90.5 fL (ref 78.0–100.0)
Monocytes Absolute: 0.9 10*3/uL (ref 0.1–1.0)
Monocytes Relative: 9 %
Neutro Abs: 5.4 10*3/uL (ref 1.7–7.7)
Neutrophils Relative %: 58 %
Platelets: 254 10*3/uL (ref 150–400)
RBC: 5.06 MIL/uL (ref 4.22–5.81)
RDW: 14.2 % (ref 11.5–15.5)
WBC: 9.4 10*3/uL (ref 4.0–10.5)

## 2015-05-07 LAB — LIPASE, BLOOD: Lipase: 29 U/L (ref 11–51)

## 2015-05-07 LAB — POC OCCULT BLOOD, ED: Fecal Occult Bld: NEGATIVE

## 2015-05-07 MED ORDER — OMEPRAZOLE 20 MG PO CPDR: 20.0000 mg | DELAYED_RELEASE_CAPSULE | Freq: Every day | ORAL | Status: DC

## 2015-05-07 MED ORDER — ONDANSETRON HCL 4 MG/2ML IJ SOLN
4.0000 mg | Freq: Once | INTRAMUSCULAR | Status: AC
  Administered 2015-05-07: 4 mg via INTRAVENOUS
  Filled 2015-05-07: qty 2

## 2015-05-07 MED ORDER — IOHEXOL 300 MG/ML  SOLN
100.0000 mL | Freq: Once | INTRAMUSCULAR | Status: AC | PRN
  Administered 2015-05-07: 100 mL via INTRAVENOUS

## 2015-05-07 MED ORDER — MORPHINE SULFATE (PF) 4 MG/ML IV SOLN
4.0000 mg | Freq: Once | INTRAVENOUS | Status: DC
  Filled 2015-05-07: qty 1

## 2015-05-07 MED ORDER — MORPHINE SULFATE (PF) 4 MG/ML IV SOLN
4.0000 mg | Freq: Once | INTRAVENOUS | Status: AC
  Administered 2015-05-07: 4 mg via INTRAVENOUS
  Filled 2015-05-07: qty 1

## 2015-05-07 NOTE — ED Notes (Signed)
Pt c/o of dark red blood in his stools x 3 days. Pt reports RLQ pain and diarrhea. Denies n/v. Denies pain with bowel movements.

## 2015-05-07 NOTE — ED Provider Notes (Signed)
CSN: [id number]     Arrival date & time 05/07/15  1221 History  By signing my name below, I, Ronney Lion, attest that this documentation has been prepared under the direction and in the presence of Glynn Octave, MD. Electronically Signed: Ronney Lion, ED Scribe. 05/07/2015. 1:10 PM.  Chief Complaint  Patient presents with  . Rectal Bleeding   The history is provided by the patient. No language interpreter was used.    HPI Comments: Blake Valentine is a 54 y.o. male who presents to the Emergency Department complaining of blood in his stool that began 3 days ago. Patient was seen here on 10/27 for right-sided abdominal pain and had a CT scan that was negative. He reports constant, unchanged abdominal pain since, although the blood in his stool is new. He states he has also had 5-6 episodes of diarrhea per day al the time he was seen, which continued through the past week and became bloody 3 days ago. He reports the toilet bowl is partially red along with brown stool after a BM, although his stool was black this morning. However, he also states he had taken Pepto-Bismol for his symptoms. He denies being on any anticoagulation. He reports the tramadol did not improve his pain. He denies a history of any chronic medical conditions, including GERD. He also denies history of abdominal surgeries. Patient states he "thinks" he has ulcers, although he denies ever having this medically confirmed with a colonoscopy or otherwise. He states he uses ibuprofen frequently but not every day. He states he has not drank alcohol "for years." He denies vomiting, fever, dizziness, lightheadedness, chest pain, SOB, constipation, hematuria, or dysuria. Patient had NKDA.    History reviewed. No pertinent past medical history. History reviewed. No pertinent past surgical history. No family history on file. Social History  Substance Use Topics  . Smoking status: Current Every Day Smoker -- 1.00 packs/day    Types:  Cigarettes  . Smokeless tobacco: None  . Alcohol Use: Yes     Comment: occ    Review of Systems  A complete 10 system review of systems was obtained and all systems are negative except as noted in the HPI and PMH.     Allergies  Review of patient's allergies indicates no known allergies.  Home Medications   Prior to Admission medications   Medication Sig Start Date End Date Taking? Authorizing Provider  traMADol (ULTRAM) 50 MG tablet Take 1 tablet (50 mg total) by mouth every 6 (six) hours as needed. Patient taking differently: Take 50 mg by mouth every 6 (six) hours as needed for moderate pain.  04/29/15  Yes Raeford Razor, MD  ibuprofen (ADVIL,MOTRIN) 200 MG tablet Take 600 mg by mouth every 6 (six) hours as needed for moderate pain.    Historical Provider, MD  omeprazole (PRILOSEC) 20 MG capsule Take 1 capsule (20 mg total) by mouth daily. 05/07/15   Glynn Octave, MD   BP 131/96 mmHg  Pulse 83  Temp(Src) 97.8 F (36.6 C) (Oral)  Resp 18  Ht  (1.727 m)  Wt 185 lb (83.915 kg)  BMI 28.14 kg/m2  SpO2 100% Physical Exam  Constitutional: He is oriented to person, place, and time. He appears well-developed and well-nourished. No distress.  Well-appearing.  HENT:  Head: Normocephalic and atraumatic.  Mouth/Throat: Oropharynx is clear and moist. No oropharyngeal exudate.  Eyes: Conjunctivae and EOM are normal. Pupils are equal, round, and reactive to light.  Neck: Normal range of motion.  Neck supple.  No meningismus.  Cardiovascular: Normal rate, regular rhythm, normal heart sounds and intact distal pulses.   No murmur heard. Pulmonary/Chest: Effort normal and breath sounds normal. No respiratory distress.  Abdominal: Soft. There is tenderness. There is guarding. There is no rebound.  TTP in RLQ, with guarding.  Genitourinary:  No testicular pain. No penile bleeding or tenderness.  Rectal exam: No fissure, no hemorrhoids, no gross blood. Minimal stool obtained.    Musculoskeletal: Normal range of motion. He exhibits no edema or tenderness.  No back pain.  Neurological: He is alert and oriented to person, place, and time. No cranial nerve deficit. He exhibits normal muscle tone. Coordination normal.  No ataxia on finger to nose bilaterally. No pronator drift. 5/5 strength throughout. CN 2-12 intact. Negative Romberg. Equal grip strength. Sensation intact. Gait is normal.   Skin: Skin is warm.  Psychiatric: He has a normal mood and affect. His behavior is normal.  Nursing note and vitals reviewed.   ED Course  Procedures (including critical care time)  DIAGNOSTIC STUDIES: Oxygen Saturation is 98% on RA, normal by my interpretation.    COORDINATION OF CARE: 12:43 PM - Discussed treatment plan with pt at bedside which includes blood tests. Pt verbalized understanding and agreed to plan.   Labs Review Labs Reviewed  COMPREHENSIVE METABOLIC PANEL - Abnormal; Notable for the following:    Potassium 3.3 (*)    Glucose, Bld 119 (*)    All other components within normal limits  URINALYSIS, ROUTINE W REFLEX MICROSCOPIC (NOT [facility]) - Abnormal; Notable for the following:    Hgb urine dipstick TRACE (*)    Ketones, ur TRACE (*)    All other components within normal limits  CBC WITH DIFFERENTIAL/PLATELET  LIPASE, BLOOD  URINE MICROSCOPIC-ADD ON  POC OCCULT BLOOD, ED  POC OCCULT BLOOD, ED     MDM   Final diagnoses:  Abdominal pain, unspecified abdominal location  Rectal bleeding   Ongoing right-sided lower abdominal pain for the past 10 days. Seen for same last week with negative CT scan.  3 days ago developed dark blood mixed with brown stool.  denies any dizziness or lightheadedness. Denies any chest pain or shortness of breath. Recently prescribed ultram.  Well-appearing on exam with right sided lower abdominal tenderness in the right lower quadrant. Rectal exam shows no hemorrhoids or fissures and no gross blood. He denies any  straining.  Hemoglobin is stable. Labs otherwise reassuring. UA negative.  With ongoing RLQ pain, CT repeated to evaluate appendix. Appendix normal on CT without other pathology.    Vitals and hemoglobin stable.  FOBT negative.  No evidence of ongoing GI bleed.  Start PPI.  Stop NSAIDs, alcohol, caffeine, spicy foods.  Followup with GI.  Return precautions discussed.  I personally performed the services described in this documentation, which was scribed in my presence. The recorded information has been reviewed and is accurate.   Glynn OctaveStephen Zilla Shartzer, MD 05/07/15 [phone]

## 2015-05-07 NOTE — Discharge Instructions (Signed)
Abdominal Pain, Adult Your testing is negative for acute problem. As discussed, avoid alcohol, caffeine, spicy foods, anti-inflammatory medications such as Motrin. Follow-up with the gastroenterologist. Return to the ED if you develop worsening pain, rectal bleeding, dizziness or any other concerns. Many things can cause abdominal pain. Usually, abdominal pain is not caused by a disease and will improve without treatment. It can often be observed and treated at home. Your health care provider will do a physical exam and possibly order blood tests and X-rays to help determine the seriousness of your pain. However, in many cases, more time must pass before a clear cause of the pain can be found. Before that point, your health care provider may not know if you need more testing or further treatment. HOME CARE INSTRUCTIONS Monitor your abdominal pain for any changes. The following actions may help to alleviate any discomfort you are experiencing:  Only take over-the-counter or prescription medicines as directed by your health care provider.  Do not take laxatives unless directed to do so by your health care provider.  Try a clear liquid diet (broth, tea, or water) as directed by your health care provider. Slowly move to a bland diet as tolerated. SEEK MEDICAL CARE IF:  You have unexplained abdominal pain.  You have abdominal pain associated with nausea or diarrhea.  You have pain when you urinate or have a bowel movement.  You experience abdominal pain that wakes you in the night.  You have abdominal pain that is worsened or improved by eating food.  You have abdominal pain that is worsened with eating fatty foods.  You have a fever. SEEK IMMEDIATE MEDICAL CARE IF:  Your pain does not go away within 2 hours.  You keep throwing up (vomiting).  Your pain is felt only in portions of the abdomen, such as the right side or the left lower portion of the abdomen.  You pass bloody or black  tarry stools. MAKE SURE YOU:  Understand these instructions.  Will watch your condition.  Will get help right away if you are not doing well or get worse.   This information is not intended to replace advice given to you by your health care provider. Make sure you discuss any questions you have with your health care provider.   Document Released: 03/29/2005 Document Revised: 03/10/2015 Document Reviewed: 02/26/2013 Elsevier Interactive Patient Education [employer]2016 [employer]

## 2015-05-17 ENCOUNTER — Encounter: Payer: Self-pay | Admitting: Internal Medicine

## 2015-06-01 ENCOUNTER — Ambulatory Visit: Payer: Self-pay | Admitting: Gastroenterology

## 2015-06-17 ENCOUNTER — Emergency Department (HOSPITAL_COMMUNITY)
Admission: EM | Admit: 2015-06-17 | Discharge: 2015-06-17 | Disposition: A | Discharge status: Home / Self Care | Payer: No Typology Code available for payment source | Attending: Emergency Medicine | Admitting: Emergency Medicine

## 2015-06-17 ENCOUNTER — Emergency Department (HOSPITAL_COMMUNITY): Payer: No Typology Code available for payment source

## 2015-06-17 ENCOUNTER — Encounter (HOSPITAL_COMMUNITY): Payer: Self-pay | Admitting: *Deleted

## 2015-06-17 DIAGNOSIS — Y998 Other external cause status: Secondary | ICD-10-CM | POA: Insufficient documentation

## 2015-06-17 DIAGNOSIS — F17210 Nicotine dependence, cigarettes, uncomplicated: Secondary | ICD-10-CM | POA: Insufficient documentation

## 2015-06-17 DIAGNOSIS — S300XXA Contusion of lower back and pelvis, initial encounter: Secondary | ICD-10-CM | POA: Insufficient documentation

## 2015-06-17 DIAGNOSIS — Y92410 Unspecified street and highway as the place of occurrence of the external cause: Secondary | ICD-10-CM | POA: Diagnosis not present

## 2015-06-17 DIAGNOSIS — S3992XA Unspecified injury of lower back, initial encounter: Secondary | ICD-10-CM | POA: Diagnosis present

## 2015-06-17 DIAGNOSIS — E119 Type 2 diabetes mellitus without complications: Secondary | ICD-10-CM | POA: Diagnosis not present

## 2015-06-17 DIAGNOSIS — Y9389 Activity, other specified: Secondary | ICD-10-CM | POA: Diagnosis not present

## 2015-06-17 MED ORDER — HYDROCODONE-ACETAMINOPHEN 5-325 MG PO TABS: 2.0000 | ORAL_TABLET | ORAL | Status: DC | PRN

## 2015-06-17 MED ORDER — HYDROCODONE-ACETAMINOPHEN 5-325 MG PO TABS
2.0000 | ORAL_TABLET | Freq: Once | ORAL | Status: AC
  Administered 2015-06-17: 2 via ORAL
  Filled 2015-06-17: qty 2

## 2015-06-17 MED ORDER — IBUPROFEN 800 MG PO TABS: 800.0000 mg | ORAL_TABLET | Freq: Three times a day (TID) | ORAL | Status: DC

## 2015-06-17 MED ORDER — KETOROLAC TROMETHAMINE 60 MG/2ML IM SOLN
60.0000 mg | Freq: Once | INTRAMUSCULAR | Status: AC
  Administered 2015-06-17: 60 mg via INTRAMUSCULAR
  Filled 2015-06-17: qty 2
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
## 2015-06-17 NOTE — Discharge Instructions (Signed)

## 2015-06-29 ENCOUNTER — Encounter (HOSPITAL_COMMUNITY): Payer: Self-pay | Admitting: Cardiology

## 2015-06-29 ENCOUNTER — Emergency Department (HOSPITAL_COMMUNITY)
Admission: EM | Admit: 2015-06-29 | Discharge: 2015-06-29 | Disposition: A | Discharge status: Home / Self Care | Payer: No Typology Code available for payment source | Attending: Emergency Medicine | Admitting: Emergency Medicine

## 2015-06-29 ENCOUNTER — Emergency Department (HOSPITAL_COMMUNITY): Payer: No Typology Code available for payment source

## 2015-06-29 DIAGNOSIS — S300XXD Contusion of lower back and pelvis, subsequent encounter: Secondary | ICD-10-CM | POA: Diagnosis not present

## 2015-06-29 DIAGNOSIS — F17210 Nicotine dependence, cigarettes, uncomplicated: Secondary | ICD-10-CM | POA: Insufficient documentation

## 2015-06-29 DIAGNOSIS — S3992XD Unspecified injury of lower back, subsequent encounter: Secondary | ICD-10-CM | POA: Diagnosis present

## 2015-06-29 DIAGNOSIS — S300XXA Contusion of lower back and pelvis, initial encounter: Secondary | ICD-10-CM

## 2015-06-29 MED ORDER — HYDROCODONE-ACETAMINOPHEN 5-325 MG PO TABS: ORAL_TABLET | ORAL | Status: DC

## 2015-06-29 MED ORDER — IBUPROFEN 800 MG PO TABS
800.0000 mg | ORAL_TABLET | Freq: Once | ORAL | Status: AC
  Administered 2015-06-29: 800 mg via ORAL
  Filled 2015-06-29: qty 1

## 2015-06-29 MED ORDER — OXYCODONE-ACETAMINOPHEN 5-325 MG PO TABS
1.0000 | ORAL_TABLET | Freq: Once | ORAL | Status: AC
  Administered 2015-06-29: 1 via ORAL
  Filled 2015-06-29: qty 1

## 2015-06-29 MED ORDER — CYCLOBENZAPRINE HCL 10 MG PO TABS: 10.0000 mg | ORAL_TABLET | Freq: Three times a day (TID) | ORAL | Status: DC | PRN

## 2015-06-29 MED ORDER — IBUPROFEN 800 MG PO TABS: 800.0000 mg | ORAL_TABLET | Freq: Three times a day (TID) | ORAL | Status: DC
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
## 2015-06-29 NOTE — Discharge Instructions (Signed)
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
## 2016-10-18 ENCOUNTER — Encounter (HOSPITAL_COMMUNITY): Payer: Self-pay | Admitting: *Deleted

## 2016-10-18 ENCOUNTER — Emergency Department (HOSPITAL_COMMUNITY)
Admission: EM | Admit: 2016-10-18 | Discharge: 2016-10-18 | Disposition: A | Discharge status: Home / Self Care | Payer: Self-pay | Attending: Emergency Medicine | Admitting: Emergency Medicine

## 2016-10-18 ENCOUNTER — Emergency Department (HOSPITAL_COMMUNITY): Payer: Self-pay

## 2016-10-18 DIAGNOSIS — F17210 Nicotine dependence, cigarettes, uncomplicated: Secondary | ICD-10-CM | POA: Insufficient documentation

## 2016-10-18 DIAGNOSIS — K029 Dental caries, unspecified: Secondary | ICD-10-CM | POA: Insufficient documentation

## 2016-10-18 DIAGNOSIS — K047 Periapical abscess without sinus: Secondary | ICD-10-CM | POA: Insufficient documentation

## 2016-10-18 DIAGNOSIS — K122 Cellulitis and abscess of mouth: Secondary | ICD-10-CM | POA: Insufficient documentation

## 2016-10-18 LAB — CBC WITH DIFFERENTIAL/PLATELET
BASOS ABS: 0 10*3/uL (ref 0.0–0.1)
Basophils Relative: 0 %
Eosinophils Absolute: 0.1 10*3/uL (ref 0.0–0.7)
Eosinophils Relative: 1 %
HEMATOCRIT: 42.2 % (ref 39.0–52.0)
HEMOGLOBIN: 14.4 g/dL (ref 13.0–17.0)
LYMPHS PCT: 13 %
Lymphs Abs: 1.9 10*3/uL (ref 0.7–4.0)
MCH: 30.3 pg (ref 26.0–34.0)
MCHC: 34.1 g/dL (ref 30.0–36.0)
MCV: 88.8 fL (ref 78.0–100.0)
MONO ABS: 1.6 10*3/uL — AB (ref 0.1–1.0)
Monocytes Relative: 11 %
NEUTROS ABS: 11.4 10*3/uL — AB (ref 1.7–7.7)
NEUTROS PCT: 75 %
Platelets: 254 10*3/uL (ref 150–400)
RBC: 4.75 MIL/uL (ref 4.22–5.81)
RDW: 14.5 % (ref 11.5–15.5)
WBC: 15 10*3/uL — AB (ref 4.0–10.5)

## 2016-10-18 LAB — BASIC METABOLIC PANEL
ANION GAP: 8 (ref 5–15)
BUN: 12 mg/dL (ref 6–20)
CALCIUM: 8.9 mg/dL (ref 8.9–10.3)
CO2: 24 mmol/L (ref 22–32)
Chloride: 103 mmol/L (ref 101–111)
Creatinine, Ser: 0.88 mg/dL (ref 0.61–1.24)
GFR calc non Af Amer: >60 mL/min (ref >60)
GLUCOSE: 95 mg/dL (ref 65–99)
POTASSIUM: 3.8 mmol/L (ref 3.5–5.1)
Sodium: 135 mmol/L (ref 135–145)

## 2016-10-18 MED ORDER — IOPAMIDOL (ISOVUE-300) INJECTION 61%
75.0000 mL | Freq: Once | INTRAVENOUS | Status: AC | PRN
  Administered 2016-10-18: 75 mL via INTRAVENOUS

## 2016-10-18 MED ORDER — CLINDAMYCIN PHOSPHATE 600 MG/50ML IV SOLN
600.0000 mg | Freq: Once | INTRAVENOUS | Status: AC
  Administered 2016-10-18: 600 mg via INTRAVENOUS
  Filled 2016-10-18: qty 50

## 2016-10-18 MED ORDER — CLINDAMYCIN HCL 300 MG PO CAPS: 300.0000 mg | ORAL_CAPSULE | Freq: Four times a day (QID) | ORAL | 0 refills | Status: DC

## 2016-10-18 MED ORDER — HYDROCODONE-ACETAMINOPHEN 5-325 MG PO TABS: ORAL_TABLET | ORAL | 0 refills | Status: DC

## 2016-10-18 MED ORDER — HYDROMORPHONE HCL 1 MG/ML IJ SOLN
1.0000 mg | Freq: Once | INTRAMUSCULAR | Status: AC
  Administered 2016-10-18: 1 mg via INTRAVENOUS
  Filled 2016-10-18: qty 1

## 2016-10-18 MED ORDER — MORPHINE SULFATE (PF) 4 MG/ML IV SOLN
6.0000 mg | Freq: Once | INTRAVENOUS | Status: AC
  Administered 2016-10-18: 6 mg via INTRAVENOUS
  Filled 2016-10-18: qty 2

## 2016-10-18 MED ORDER — BENZOCAINE (TOPICAL) 20 % EX AERO
INHALATION_SPRAY | Freq: Four times a day (QID) | CUTANEOUS | Status: DC | PRN
  Filled 2016-10-18: qty 57

## 2016-10-18 MED ORDER — HYDROMORPHONE HCL 1 MG/ML IJ SOLN
1.0000 mg | Freq: Once | INTRAMUSCULAR | Status: AC
Start: 2016-10-18 — End: 2016-10-18
  Administered 2016-10-18: 1 mg via INTRAVENOUS
  Filled 2016-10-18: qty 1

## 2016-10-18 NOTE — ED Notes (Signed)
Patient transported to CT 

## 2016-10-18 NOTE — ED Triage Notes (Signed)
Pt comes in with swelling to his upper lip and nose. Pt states this started last night. He believes this came from a broken tooth (broken last month). Denies any allergies or use of lisinopril. Pt states it hurts to breath. No throat swelling noted.

## 2016-10-18 NOTE — ED Notes (Signed)
Dr Erin Hearing notified of pain

## 2016-10-18 NOTE — ED Provider Notes (Signed)
[facility] DEPT Provider Note   CSN: [id number] Arrival date & time: 10/18/16  1202     History   Chief Complaint Chief Complaint  Patient presents with  . Oral Swelling    HPI Blake Valentine is a 56 y.o. male.  HPI  56 year old male with dental caries presents immersed department with oral swelling. Patient states that last night his upper lip became more swollen than it had been previously. He had been dealing with an abscess around this tooth before that. Does not take any medications over-the-counter or prescribed. No systemic symptoms. No difficulty swallowing or breathing. Mild drainage from the wound. No other associated or modifying factors.  History reviewed. No pertinent past medical history.  There are no active problems to display for this patient.   History reviewed. No pertinent surgical history.     Home Medications    Prior to Admission medications   Medication Sig Start Date End Date Taking? Authorizing Provider  clindamycin (CLEOCIN) 300 MG capsule Take 1 capsule (300 mg total) by mouth 4 (four) times daily. X 7 days 10/18/16   Marily Memos, MD  HYDROcodone-acetaminophen (NORCO/VICODIN) 5-325 MG tablet Take one-two tabs po q 4-6 hrs prn pain 10/18/16   Marily Memos, MD    Family History No family history on file.  Social History Social History  Substance Use Topics  . Smoking status: Current Every Day Smoker    Packs/day: 1.00    Types: Cigarettes  . Smokeless tobacco: Never Used  . Alcohol use Yes     Comment: occ     Allergies   Patient has no known allergies.   Review of Systems Review of Systems  Constitutional: Positive for appetite change.  HENT: Positive for dental problem (oral swelling with abscess) and facial swelling.   All other systems reviewed and are negative.    Physical Exam Updated Vital Signs BP (!) 142/77 (BP Location: Right Arm)   Pulse 76   Temp 98 F (36.7 C) (Oral)   Resp 18   Ht  (1.727 m)    Wt 175 lb (79.4 kg)   SpO2 100%   BMI 26.61 kg/m   Physical Exam  Constitutional: He is oriented to person, place, and time. He appears well-developed and well-nourished.  HENT:  Head: Normocephalic and atraumatic.  Patient with upper lip swelling associated with what appears to be an a abscess on inner side of lip that is directly opposed to multiple dental caries  Eyes: Conjunctivae and EOM are normal. Pupils are equal, round, and reactive to light.  Neck: Normal range of motion. No thyromegaly present.  Cardiovascular: Normal rate.   Pulmonary/Chest: Effort normal. No respiratory distress.  Abdominal: Soft. He exhibits no distension. There is no tenderness.  Musculoskeletal: Normal range of motion.  Neurological: He is alert and oriented to person, place, and time.  Skin: Skin is warm and dry.  Nursing note and vitals reviewed.    ED Treatments / Results  Labs (all labs ordered are listed, but only abnormal results are displayed) Labs Reviewed  CBC WITH DIFFERENTIAL/PLATELET - Abnormal; Notable for the following:       Result Value   WBC 15.0 (*)    Neutro Abs 11.4 (*)    Monocytes Absolute 1.6 (*)    All other components within normal limits  BASIC METABOLIC PANEL    EKG  EKG Interpretation None       Radiology Ct Maxillofacial W Contrast  Result Date: 10/18/2016 CLINICAL DATA:  56 year old male with dental pain since yesterday, facial swelling beginning today. EXAM: CT MAXILLOFACIAL WITH CONTRAST TECHNIQUE: Multidetector CT imaging of the maxillofacial structures was performed. Multiplanar CT image reconstructions were also generated. A small metallic BB was placed on the right temple in order to reliably differentiate right from left. CONTRAST:  75 mL Isovue-300 COMPARISON:  None. FINDINGS: Osseous: Maxillary dentition is frequently poor. Carious maxillary teeth with superimposed periapical lucency, maximal about the residual maxillary incisors (series 3, image  56). Anterior cortical breakthrough bilaterally (same image). Associated subperiosteal abscess tracking from the anterior maxilla alveolar process cephalad toward the right nasal labial fold with overlying severe lip soft tissue swelling. The most confluent portion of the abscess is 7 x 20 x 12 mm (AP by transverse by CC). See series 2, image 55 and series 5, image 36. Otherwise no acute osseous abnormality in the visible face or skull base. Orbits: Orbital walls intact. Mild right inferior orbit preseptal soft tissue inflammation. Globes and bilateral intraorbital soft tissues are normal. Sinuses: Clear.  Tympanic cavities and mastoids are clear. Soft tissues: Severe lip soft tissue swelling with skin thickening and subcutaneous stranding as stated above. Right anterior maxilla alveolar process subperiosteal abscess as stated above. Right greater than left premalar soft tissue swelling. The right masticator space appears spared. No other discrete fluid collection. No upper cervical lymphadenopathy at this time. Negative visible thyroid, larynx, pharynx, parapharyngeal spaces and retropharyngeal space. Sublingual space, submandibular glands, and parotid glands are within normal limits. Visualized orbit soft tissues are within normal limits. Limited intracranial: Negative. Visible major vascular structures in the neck and at the skullbase are patent, including the cavernous sinus. IMPRESSION: 1. Poor dentition with dental caries and periapical lucency. Anterior cortical breakthrough at the bilateral maxillary incisors. Associated small subperiosteal abscess (12 x 20 mm) tracking cephalad from the right maxillary incisors (series 2, image 55). 2. Associated right facial cellulitis. No other fluid collection or complicating features. Electronically Signed   By: Odessa Fleming M.D.   On: 10/18/2016 15:36    Procedures .Marland KitchenIncision and Drainage Date/Time: 10/18/2016 10:12 PM Performed by: Marily Memos Authorized by:  Marily Memos   Consent:    Consent obtained:  Verbal   Consent given by:  Patient   Risks discussed:  Bleeding, incomplete drainage and infection   Alternatives discussed:  No treatment Location:    Type:  Abscess   Size:  2   Location:  Mouth   Mouth location: lip and anterior teeth. Pre-procedure details:    Skin preparation:  Antiseptic wash Anesthesia (see MAR for exact dosages):    Anesthesia method:  Topical application   Topical anesthesia: hurricaine spray. Procedure type:    Complexity:  Simple Procedure details:    Needle aspiration: no     Incision types:  Stab incision   Scalpel blade:  11   Wound management:  Probed and deloculated   Drainage:  Bloody and purulent   Drainage amount:  Moderate   Wound treatment:  Wound left open Post-procedure details:    Patient tolerance of procedure:  Tolerated well, no immediate complications   (including critical care time)  Medications Ordered in ED Medications  morphine 4 MG/ML injection 6 mg (6 mg Intravenous Given 10/18/16 1316)  clindamycin (CLEOCIN) IVPB 600 mg (0 mg Intravenous Stopped 10/18/16 1356)  HYDROmorphone (DILAUDID) injection 1 mg (1 mg Intravenous Given 10/18/16 1521)  iopamidol (ISOVUE-300) 61 % injection 75 mL (75 mLs Intravenous Contrast Given 10/18/16 1510)  HYDROmorphone (DILAUDID) injection  1 mg (1 mg Intravenous Given 10/18/16 1645)     Initial Impression / Assessment and Plan / ED Course  I have reviewed the triage vital signs and the nursing notes.  Pertinent labs & imaging results that were available during my care of the patient were reviewed by me and considered in my medical decision making (see chart for details).      Suspect localized inflammation secondary to abscess but due to extent of swelling, will ct to ensure not more involved prior to I&D.   Abscess drained as above. Cellulitis treated with clindamycin. Patient will follow-up with a dentist as soon as possible for consultation  for likely extractions.  Final Clinical Impressions(s) / ED Diagnoses   Final diagnoses:  Dental abscess    New Prescriptions Discharge Medication List as of 10/18/2016  6:23 PM    START taking these medications   Details  clindamycin (CLEOCIN) 300 MG capsule Take 1 capsule (300 mg total) by mouth 4 (four) times daily. X 7 days, Starting Wed 10/18/2016, Print         Marily Memos, MD 10/18/16 2213

## 2016-10-18 NOTE — ED Triage Notes (Signed)
Pt with dental pain started yesterday, today with significant swelling to upper lip and facial swelling, pt in no distress at this time-talking in complete sentences.

## 2016-12-13 ENCOUNTER — Encounter (HOSPITAL_COMMUNITY): Payer: Self-pay | Admitting: *Deleted

## 2016-12-15 ENCOUNTER — Encounter (HOSPITAL_COMMUNITY): Payer: Self-pay | Admitting: Emergency Medicine

## 2016-12-15 ENCOUNTER — Emergency Department (HOSPITAL_COMMUNITY): Payer: Self-pay

## 2016-12-15 ENCOUNTER — Emergency Department (HOSPITAL_COMMUNITY)
Admission: EM | Admit: 2016-12-15 | Discharge: 2016-12-15 | Disposition: A | Discharge status: Home / Self Care | Payer: Self-pay | Attending: Emergency Medicine | Admitting: Emergency Medicine

## 2016-12-15 DIAGNOSIS — M79672 Pain in left foot: Secondary | ICD-10-CM | POA: Insufficient documentation

## 2016-12-15 DIAGNOSIS — F17210 Nicotine dependence, cigarettes, uncomplicated: Secondary | ICD-10-CM | POA: Insufficient documentation

## 2016-12-15 DIAGNOSIS — M79671 Pain in right foot: Secondary | ICD-10-CM | POA: Insufficient documentation

## 2016-12-15 MED ORDER — IBUPROFEN 800 MG PO TABS: 800.0000 mg | ORAL_TABLET | Freq: Three times a day (TID) | ORAL | 0 refills | Status: DC

## 2016-12-15 MED ORDER — HYDROCODONE-ACETAMINOPHEN 5-325 MG PO TABS
1.0000 | ORAL_TABLET | Freq: Once | ORAL | Status: AC
  Administered 2016-12-15: 1 via ORAL
  Filled 2016-12-15: qty 1

## 2016-12-15 MED ORDER — TRAMADOL HCL 50 MG PO TABS
50.0000 mg | ORAL_TABLET | Freq: Four times a day (QID) | ORAL | 0 refills | Status: DC | PRN
Start: 2016-12-15 — End: 2023-09-03

## 2016-12-15 NOTE — Discharge Instructions (Signed)
Wear the ace wraps and use crutches to minimize weight bearing.  Use the medicines prescribed.  You may also try using a heating pad applied 20 minutes several times daily.  Call Dr. Nolen MuMcKinney for further evaluation of your feet if your pain persists.

## 2016-12-15 NOTE — ED Triage Notes (Signed)
Patient complaining of bilateral foot pain since last night. Denies injury.

## 2016-12-17 NOTE — ED Provider Notes (Signed)
[facility] Provider Note   CSN: [id number] Arrival date & time: 12/15/16  1407     History   Chief Complaint Chief Complaint  Patient presents with  . Foot Pain    HPI Mendel CorningWendell V Rounds is a 56 y.o. male presenting for evaluation of painful raised nodules on his bilateral feet at the insteps which he first noticed yesterday. 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  Pain is worse with weight bearing and palpation.  It is sharp and does not radiate. Denies injury, bites, denies walking barefoot or any other possible explanation for these sx.  He has found no alleviators.  The history is provided by the patient.    History reviewed. No pertinent past medical history.  Patient Active Problem List   Diagnosis Date Noted  . Closed dislocation of acromioclavicular (joint) 06/19/2012    History reviewed. No pertinent surgical history.     Home Medications    Prior to Admission medications   Medication Sig Start Date End Date Taking? Authorizing Provider  clindamycin (CLEOCIN) 300 MG capsule Take 1 capsule (300 mg total) by mouth 4 (four) times daily. X 7 days Patient not taking: Reported on 12/15/2016 10/18/16   Mesner, Barbara CowerJason, MD  ibuprofen (ADVIL,MOTRIN) 800 MG tablet Take 1 tablet (800 mg total) by mouth 3 (three) times daily. 12/15/16   Burgess AmorIdol, Ianmichael Amescua, PA-C  traMADol (ULTRAM) 50 MG tablet Take 1 tablet (50 mg total) by mouth every 6 (six) hours as needed. 12/15/16   Burgess AmorIdol, Atiba Kimberlin, PA-C    Family History Family History  Problem Relation Age of Onset  . Diabetes Unknown     Social History Social History  Substance Use Topics  . Smoking status: Current Every Day Smoker    Packs/day: 1.00    Types: Cigarettes  . Smokeless tobacco: Never Used  . Alcohol use Yes     Comment: occasionally     Allergies   Patient  has no known allergies.   Review of Systems Review of Systems  Constitutional: Negative for fever.  Musculoskeletal: Positive for arthralgias. Negative for joint swelling and myalgias.  Skin:       Negative except as mentioned in HPI.   Neurological: Negative for weakness and numbness.     Physical Exam Updated Vital Signs BP 132/88   Pulse 78   Temp 98 F (36.7 C)   Resp 18   Ht 5\' 8"  (1.727 m)   Wt 83.9 kg (185 lb)   SpO2 98%   BMI 28.13 kg/m   Physical Exam  Constitutional: He appears well-developed and well-nourished.  HENT:  Head: Atraumatic.  Neck: Normal range of motion.  Cardiovascular:  Pulses equal bilaterally  Musculoskeletal: He exhibits tenderness.  Bilateral ttp at each instep.  Slightly raised approx 1 cm tender induration x 1 each foot medially. No erythema, skin intact.  Neurological: He is alert. He has normal strength. He displays normal reflexes. No sensory deficit.  Skin: Skin is warm and dry.  Psychiatric: He has a normal mood and affect.     ED Treatments / Results  Labs (all labs ordered are listed, but only abnormal results are displayed) Labs Reviewed - No data to display  EKG  EKG Interpretation None       Radiology Dg Foot Complete Left  Result  Date: 12/15/2016 CLINICAL DATA:  Left foot pain along the instep. No known injury. Painful bump noticed yesterday. EXAM: LEFT FOOT - COMPLETE 3+ VIEW COMPARISON:  None. FINDINGS: Slight bony protuberances involving the medial aspect of the tarsal navicular without focal bony lesion. 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 No acute fracture, bone destruction or dislocation is seen. IMPRESSION: Slight bony protuberance of the tarsal navicular which may be contributing to extrinsic mass effect on adjacent soft tissues and causing slight overlying soft  tissue edema and swelling. Electronically Signed   By: Tollie Eth M.D.   On: 12/15/2016 16:03    Procedures Procedures (including critical care time)  Medications Ordered in ED Medications  HYDROcodone-acetaminophen (NORCO/VICODIN) 5-325 MG per tablet 1 tablet (1 tablet Oral Given 12/15/16 1510)     Initial Impression / Assessment and Plan / ED Course  I have reviewed the triage vital signs and the nursing notes.  Pertinent labs & imaging results that were available during my care of the patient were reviewed by me and considered in my medical decision making (see chart for details).     Tender localized nodule bilateral feet at instep of unclear etiology.  Pt was advised warm soaks, elevation, heat tx.  Ace wraps, crutches provided as pt endorsed severe pain with weight bearing. Left foot imaged as this was the more tender lesion. Referral to podiatry given. Final Clinical Impressions(s) / ED Diagnoses   Final diagnoses:  Foot pain, left  Foot pain, right    New Prescriptions Discharge Medication List as of 12/15/2016  4:28 PM    START taking these medications   Details  traMADol (ULTRAM) 50 MG tablet Take 1 tablet (50 mg total) by mouth every 6 (six) hours as needed., Starting Fri 12/15/2016, Print         Burgess Amor, PA-C 12/17/16 2338    Bethann Berkshire, MD 12/18/16 [phone]

## 2017-09-14 IMAGING — CT CT MAXILLOFACIAL W/ CM
3 of 4 series · 15 of 47 positions shown, 18 images · IV contrast (Isovue)
Comparison: None.

CLINICAL DATA: 55-year-old male with dental pain since yesterday,
facial swelling beginning today.

EXAM:
CT MAXILLOFACIAL WITH CONTRAST
TECHNIQUE: Multidetector CT imaging of the maxillofacial structures was
performed. Multiplanar CT image reconstructions were also generated.
A small metallic BB was placed on the right temple in order to
reliably differentiate right from left.
CONTRAST:  75 mL 3sovue-ICC

[Series 2: max soft · axial · 0.32mm/px · z∈[-48,+102]mm · 11 of 87 slices shown, 14 images]
[im 6/87  brain]
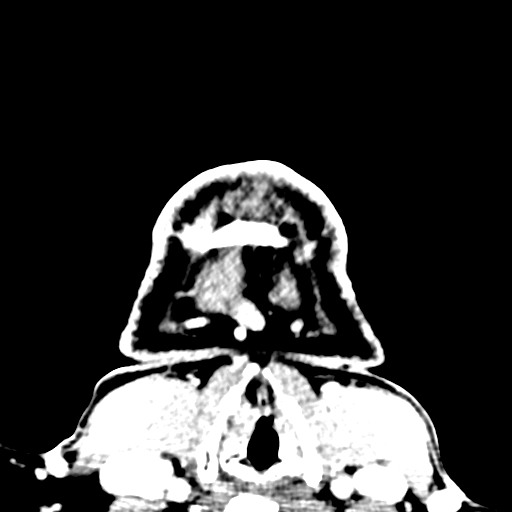
[im 6/87  bone]
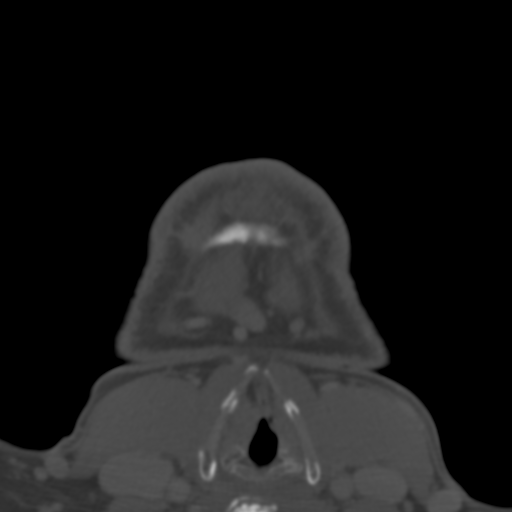
[im 12/87  bone]
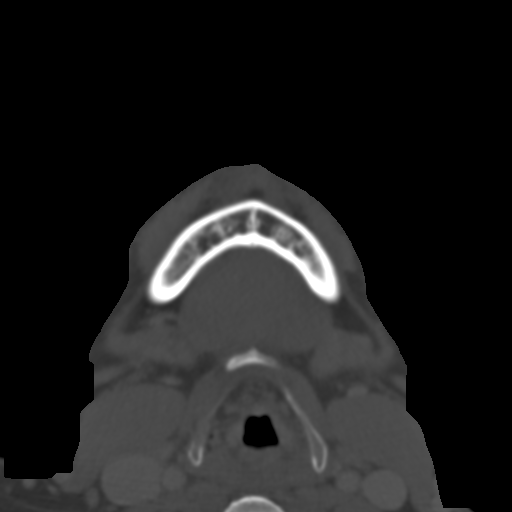
[im 21/87  bone]
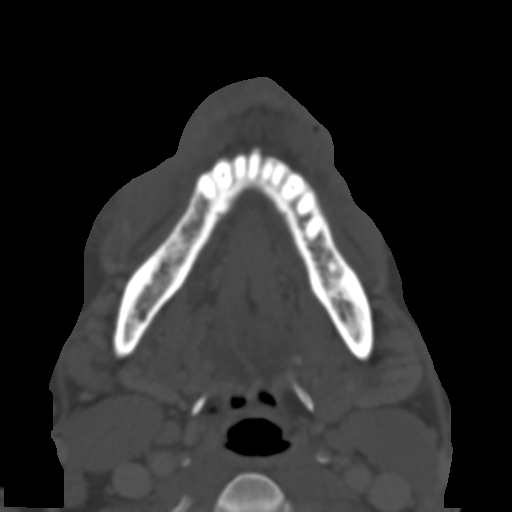
[im 27/87  bone]
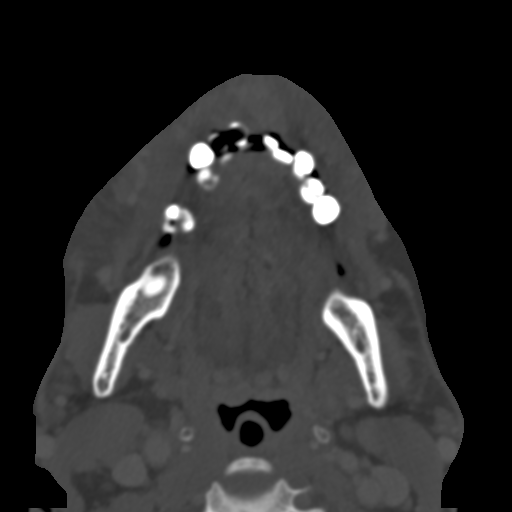
[im 36/87  brain]
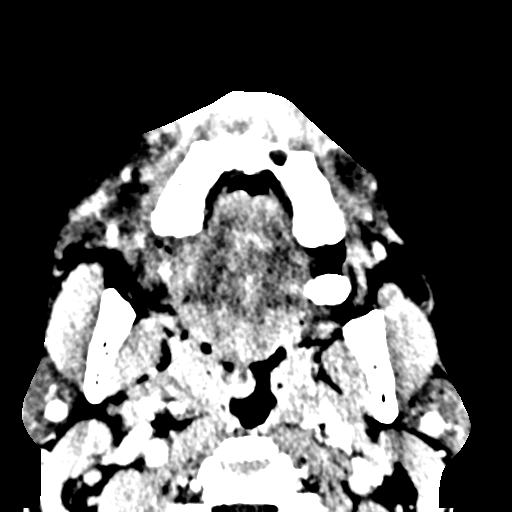
[im 36/87  bone]
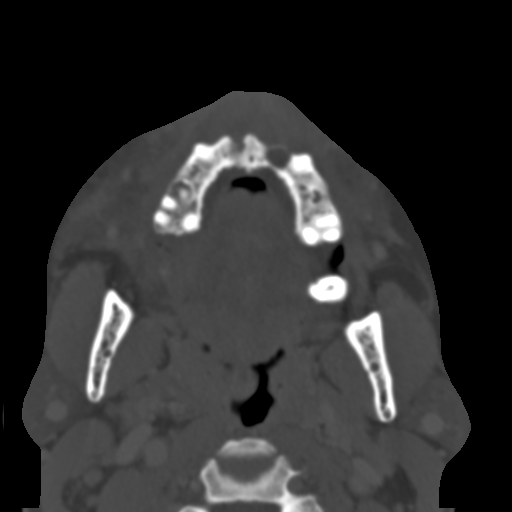
[im 45/87  bone]
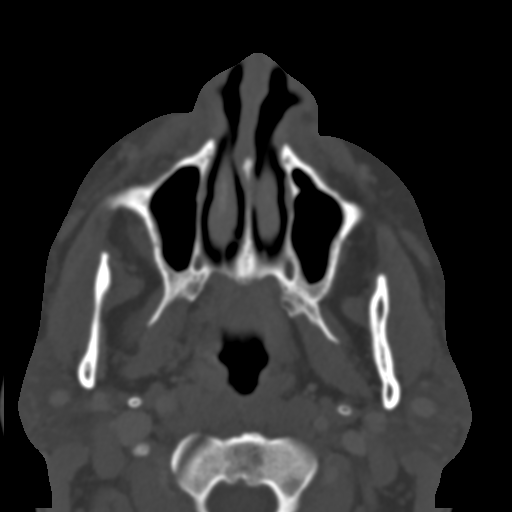
[im 51/87  bone]
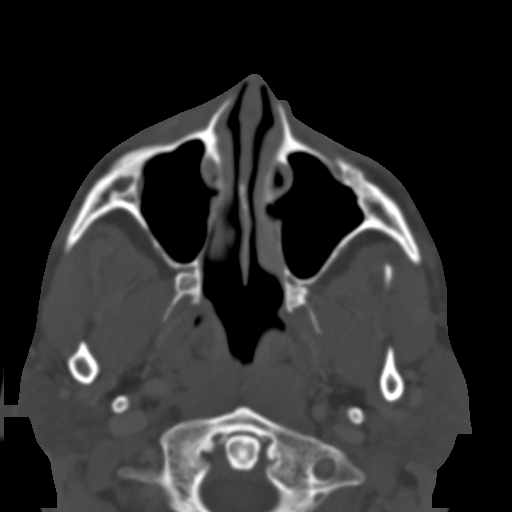
[im 60/87  bone]
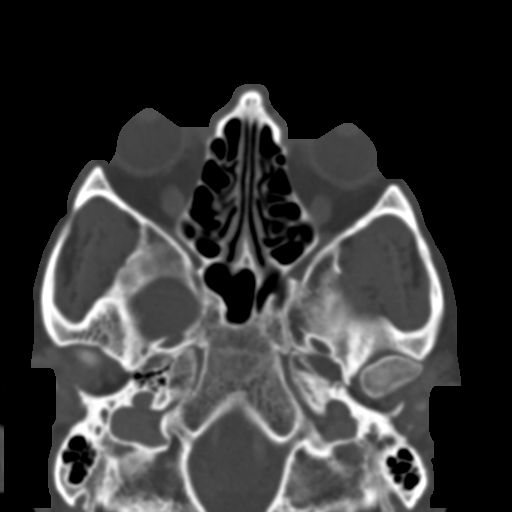
[im 66/87  brain]
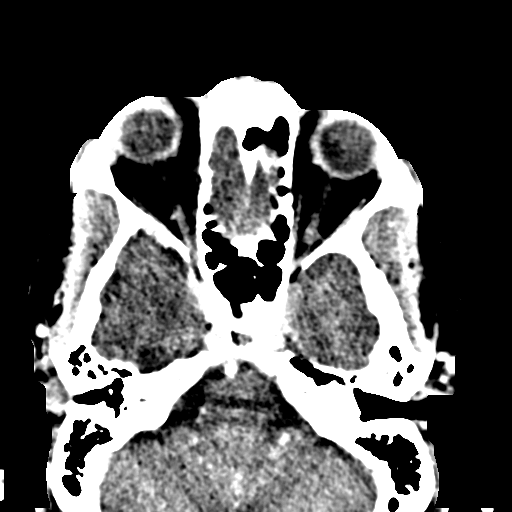
[im 66/87  bone]
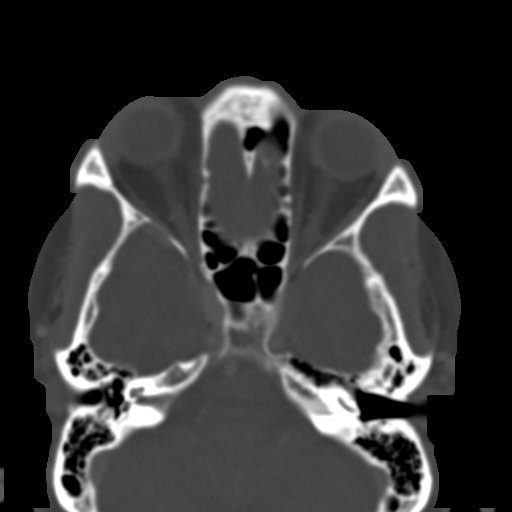
[im 75/87  bone]
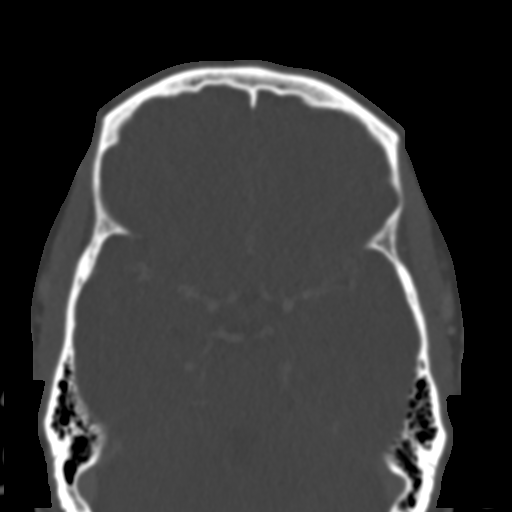
[im 81/87  bone]
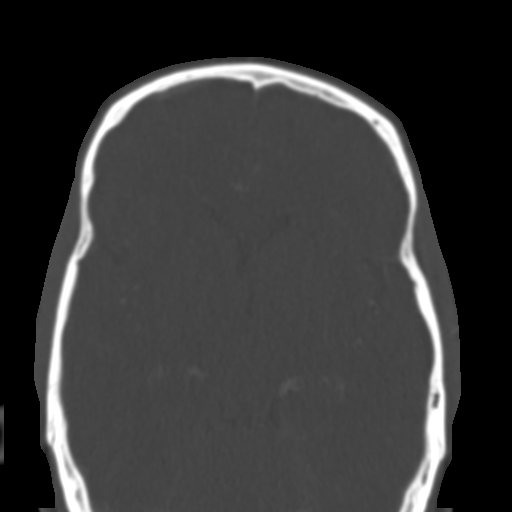

[Series 4: coronal soft · coronal · 0.33mm/px · 3 of 74 slices shown]
[im 25/74  bone]
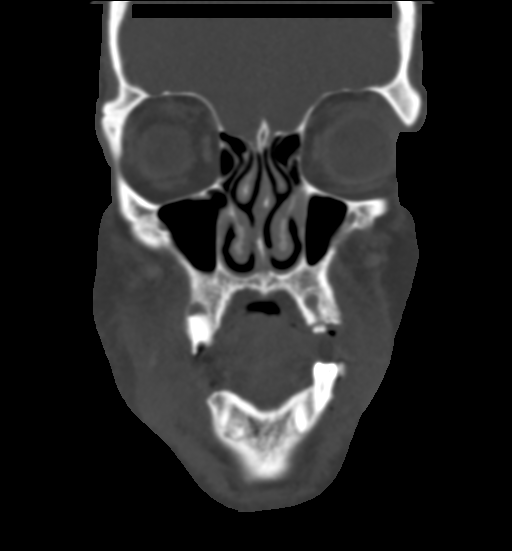
[im 33/74  bone]
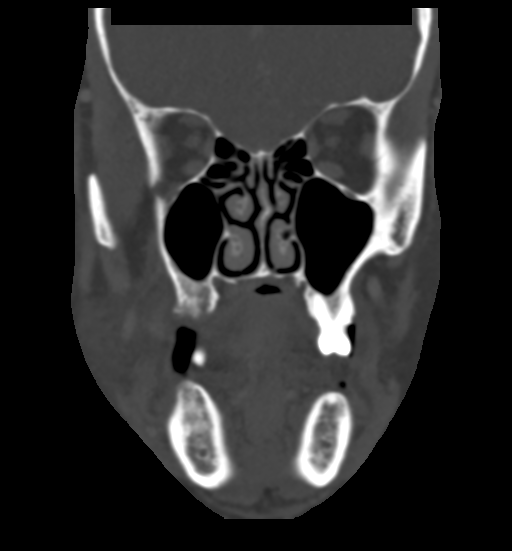
[im 41/74  bone]
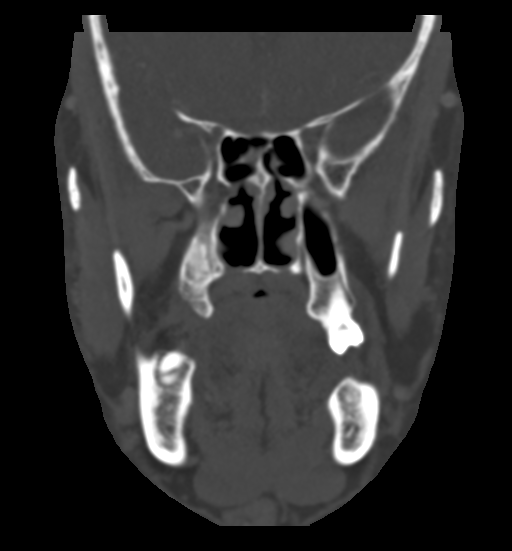

[Series 7: sagittal bone · sagittal · 0.35mm/px · 1 of 79 slices shown]
[im 40/79  bone]
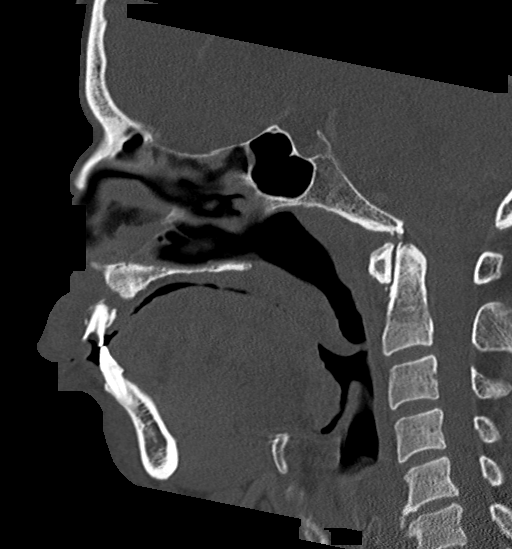

[15 of 47 positions shown; findings below may reference images not displayed]

FINDINGS: Osseous: Maxillary dentition is frequently poor. Carious maxillary
teeth with superimposed periapical lucency, maximal about the
residual maxillary incisors (series 3, image 56). Anterior cortical
breakthrough bilaterally (same image).

Associated subperiosteal abscess tracking from the anterior maxilla
alveolar process cephalad toward the right nasal labial fold with
overlying severe lip soft tissue swelling. The most confluent
portion of the abscess is 7 x 20 x 12 mm (AP by transverse by CC).
See series 2, image 55 and series 5, image 36.

Otherwise no acute osseous abnormality in the visible face or skull
base.

Orbits: Orbital walls intact. Mild right inferior orbit preseptal
soft tissue inflammation. Globes and bilateral intraorbital soft
tissues are normal.

Sinuses: Clear.  Tympanic cavities and mastoids are clear.

Soft tissues: Severe lip soft tissue swelling with skin thickening
and subcutaneous stranding as stated above. Right anterior maxilla
alveolar process subperiosteal abscess as stated above. Right
greater than left premalar soft tissue swelling. The right
masticator space appears spared. No other discrete fluid collection.

No upper cervical lymphadenopathy at this time.

Negative visible thyroid, larynx, pharynx, parapharyngeal spaces and
retropharyngeal space. Sublingual space, submandibular glands, and
parotid glands are within normal limits. Visualized orbit soft
tissues are within normal limits.

Limited intracranial: Negative.

Visible major vascular structures in the neck and at the skullbase
are patent, including the cavernous sinus.
IMPRESSION: 1. Poor dentition with dental caries and periapical lucency.
Anterior cortical breakthrough at the bilateral maxillary incisors.
Associated small subperiosteal abscess (12 x 20 mm) tracking
cephalad from the right maxillary incisors (series 2, image 55).
2. Associated right facial cellulitis. No other fluid collection or
complicating features.

## 2017-11-11 IMAGING — DX DG FOOT COMPLETE 3+V*L*
3 series · 3 of 3 positions shown · non-contrast
Comparison: None.

CLINICAL DATA: Left foot pain along the instep. No known injury.
Painful bump noticed yesterday.

EXAM:
LEFT FOOT - COMPLETE 3+ VIEW

[foot ap]
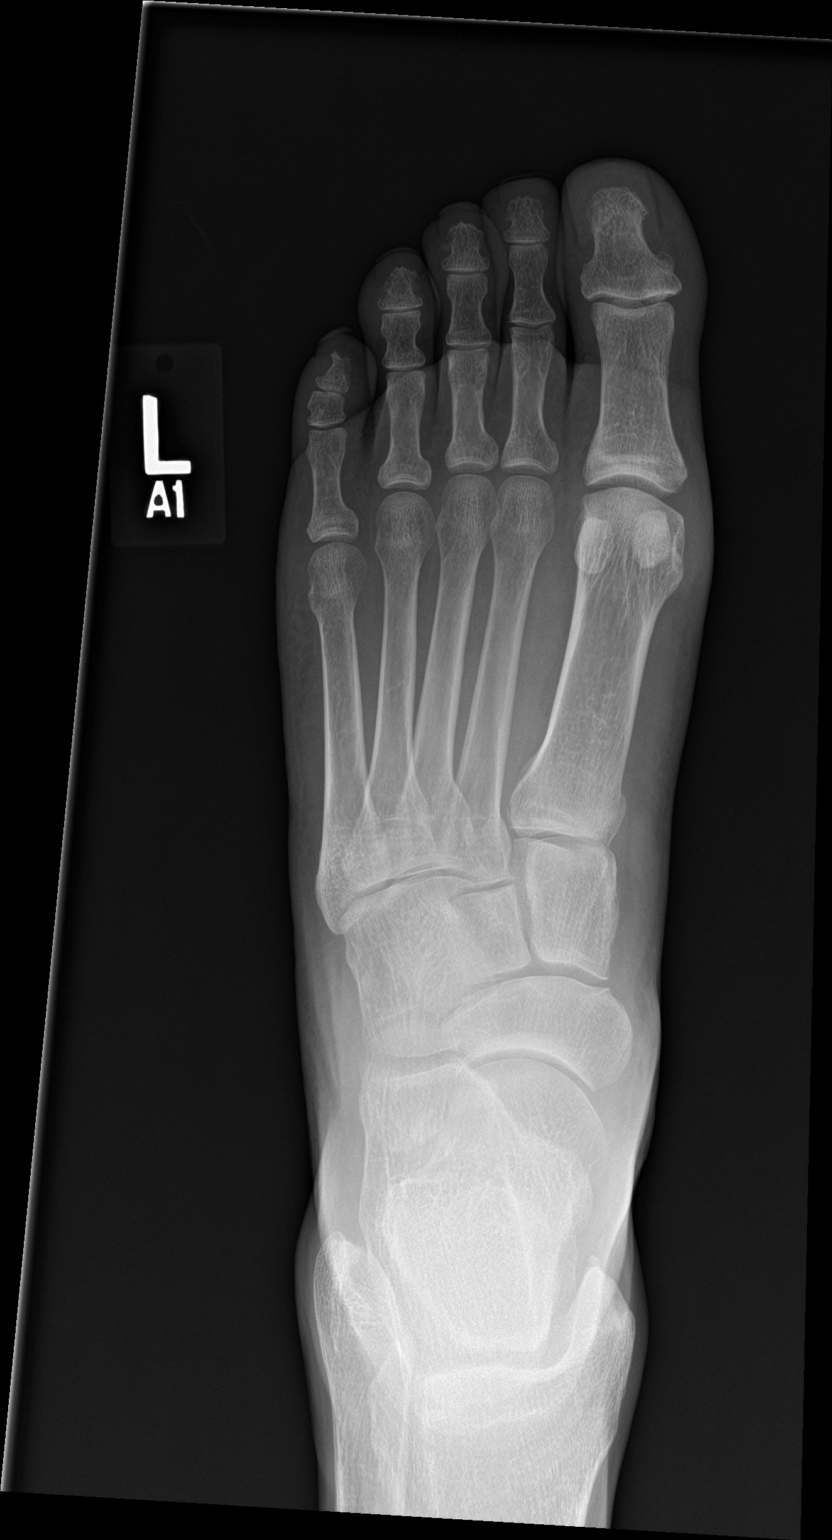

[foot obl]
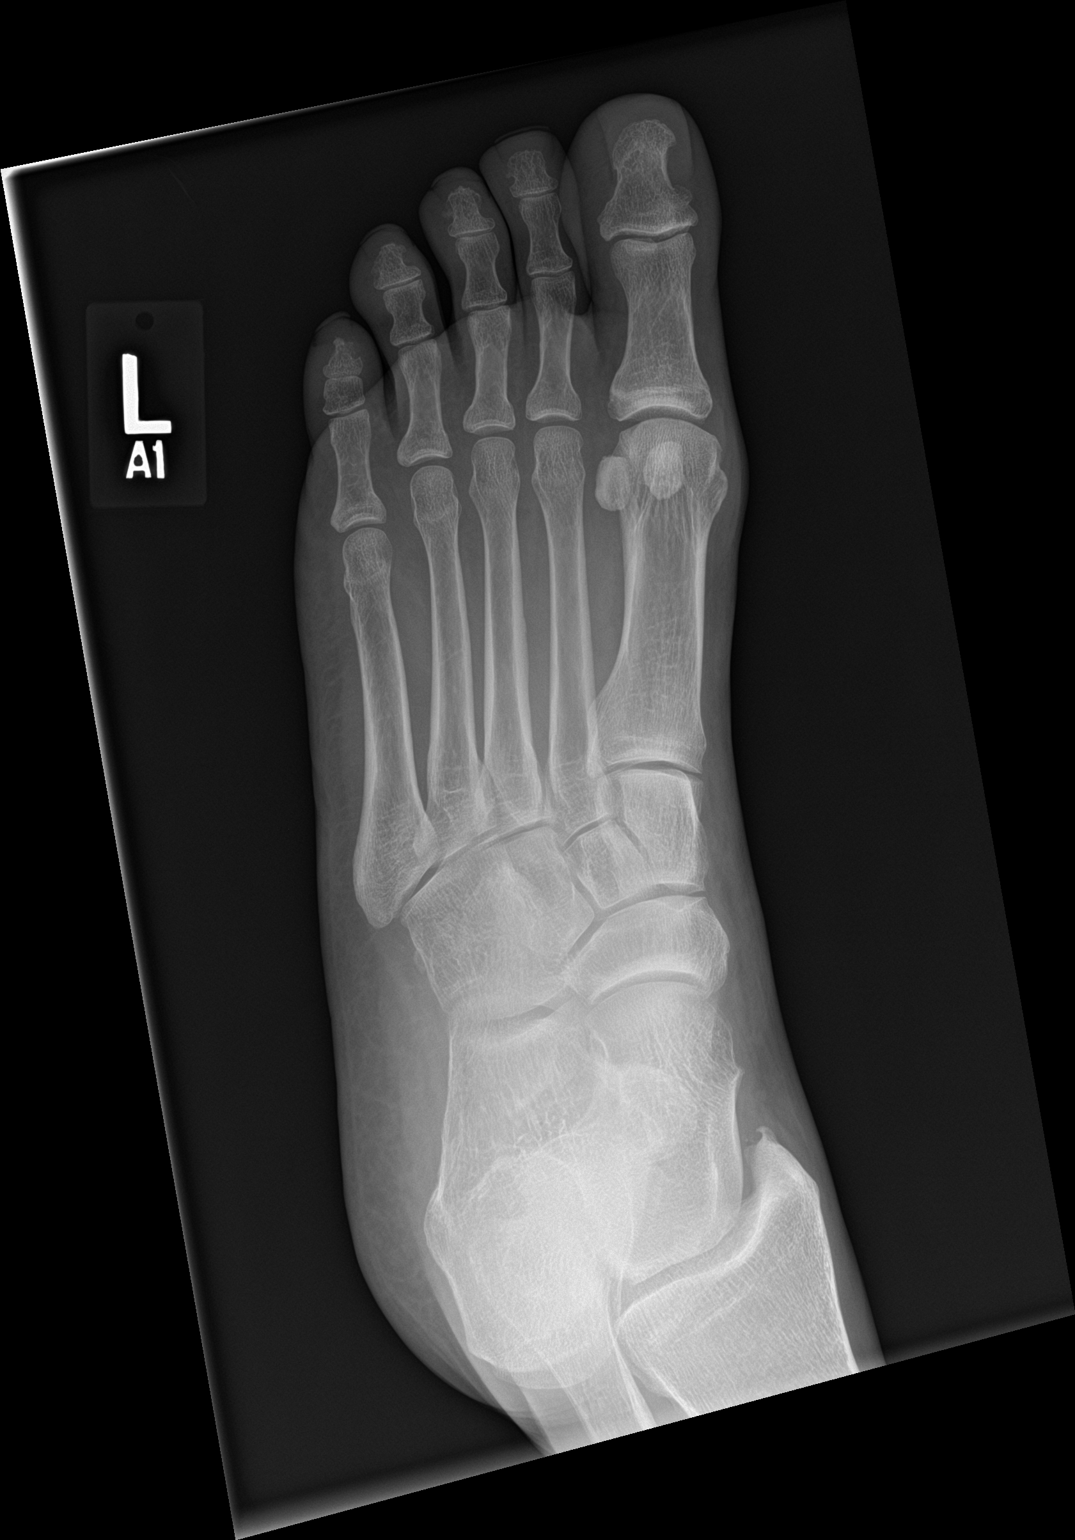

[foot lat]
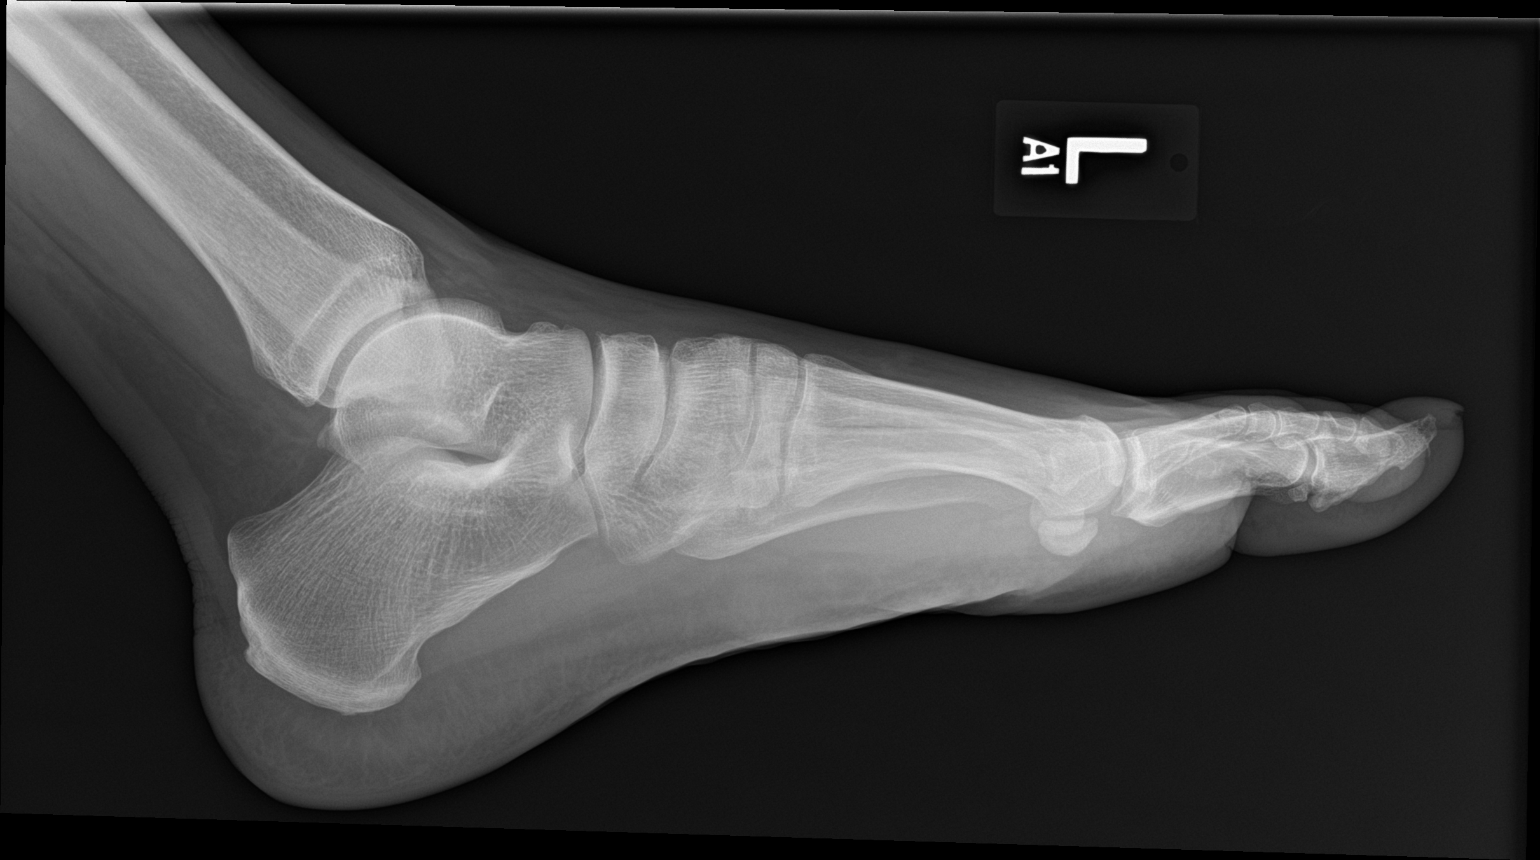

[3 of 3 positions shown; findings below may reference images not displayed]

FINDINGS: Slight bony protuberances involving the medial aspect of the tarsal
navicular without focal bony lesion. This is likely developmental
but does appear to be causing slight soft tissue edema and minimal
swelling overlying the medial aspect of the midfoot [place] to an os
naviculare syndrome. No acute fracture, bone destruction or
dislocation is seen.
IMPRESSION: Slight bony protuberance of the tarsal navicular which may be
contributing to extrinsic mass effect on adjacent soft tissues and
causing slight overlying soft tissue edema and swelling.

## 2017-11-20 ENCOUNTER — Encounter (HOSPITAL_COMMUNITY): Payer: Self-pay | Admitting: Emergency Medicine

## 2017-11-20 ENCOUNTER — Other Ambulatory Visit: Payer: Self-pay

## 2017-11-20 ENCOUNTER — Emergency Department (HOSPITAL_COMMUNITY)
Admission: EM | Admit: 2017-11-20 | Discharge: 2017-11-20 | Disposition: A | Discharge status: Home / Self Care | Payer: Self-pay | Attending: Emergency Medicine | Admitting: Emergency Medicine

## 2017-11-20 DIAGNOSIS — G44209 Tension-type headache, unspecified, not intractable: Secondary | ICD-10-CM | POA: Insufficient documentation

## 2017-11-20 DIAGNOSIS — R040 Epistaxis: Secondary | ICD-10-CM | POA: Insufficient documentation

## 2017-11-20 DIAGNOSIS — I10 Essential (primary) hypertension: Secondary | ICD-10-CM | POA: Insufficient documentation

## 2017-11-20 DIAGNOSIS — F17210 Nicotine dependence, cigarettes, uncomplicated: Secondary | ICD-10-CM | POA: Insufficient documentation

## 2017-11-20 HISTORY — DX: Essential (primary) hypertension: I10

## 2017-11-20 MED ORDER — OXYMETAZOLINE HCL 0.05 % NA SOLN
1.0000 | Freq: Once | NASAL | Status: AC
  Administered 2017-11-20: 1 via NASAL
  Filled 2017-11-20: qty 15

## 2017-11-20 MED ORDER — IBUPROFEN 400 MG PO TABS
400.0000 mg | ORAL_TABLET | Freq: Once | ORAL | Status: AC
  Administered 2017-11-20: 400 mg via ORAL
  Filled 2017-11-20: qty 1

## 2017-11-20 NOTE — ED Triage Notes (Signed)
PT states he has been out of his HTN medication that was prescribed by a MD at [facility] ER last year. PT states at 0530 this am he has a nose bleed and a headache. PT states he headache has gotten better upon arrival to ED.

## 2017-11-20 NOTE — ED Notes (Signed)
Pt only complaining of a slight headache. Alert and oriented x4.

## 2017-11-20 NOTE — Discharge Instructions (Addendum)
You were seen today for headache and nosebleed.  Your symptoms loosely resolved prior to being evaluated.  Your blood pressure is slightly elevated.  However, you need to follow-up with primary physician to be reevaluated the blood pressure log.  In the meantime decrease salt in your diet.  Regarding your nosebleed, use nasal saline to keep your mucous membranes moist.  Afrin as needed for up to 3 days.

## 2017-11-20 NOTE — ED Provider Notes (Signed)
[facility] EMERGENCY DEPARTMENT Provider Note   CSN: [id number] Arrival date & time: 11/20/17  4098     History   Chief Complaint Chief Complaint  Patient presents with  . Hypertension    HPI Blake Valentine is a 57 y.o. male.  HPI  This is a 57 year old male with a history of hypertension who presents with headache and epistaxis.  Patient reports that he woke up this morning with a frontal headache.  He noticed that his right naris was bleeding.  He states that the headache was throbbing and nonradiating.  Denies any fevers or neck stiffness.  Denies worst headache of his life.  Denies any neurologic symptoms including photophobia, vision changes, weakness, numbness, tingling.  States he has had headaches in the past associated with blood pressure.  Currently he states his headache is much improved.  Currently 3 out of 10.  He did not take anything for his headache.  He previously has been on blood pressure medication that was prescribed by an ER doc.  He does not have a primary care physician.  Patient noted slight breathing out of his left naris.  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  Denies any recent congestion.  Bleeding resolved prior to arrival.  Patient denies recent drug use specifically cocaine use.  Past Medical History:  Diagnosis Date  . Hypertension     Patient Active Problem List   Diagnosis Date Noted  . Closed dislocation of acromioclavicular (joint) 06/19/2012    History reviewed. No pertinent surgical history.      Home Medications    Prior to Admission medications   Medication Sig Start Date End Date Taking? Authorizing Provider  clindamycin (CLEOCIN) 300 MG capsule Take 1 capsule (300 mg total) by mouth 4 (four) times daily. X 7 days Patient not taking: Reported on 12/15/2016 10/18/16   Mesner, Barbara Cower, MD  ibuprofen (ADVIL,MOTRIN) 800 MG tablet Take 1 tablet (800 mg total) by mouth 3 (three) times daily. 12/15/16   Burgess Amor, PA-C    traMADol (ULTRAM) 50 MG tablet Take 1 tablet (50 mg total) by mouth every 6 (six) hours as needed. 12/15/16   Burgess Amor, PA-C    Family History Family History  Problem Relation Age of Onset  . Diabetes Unknown     Social History Social History   Tobacco Use  . Smoking status: Current Every Day Smoker    Packs/day: 1.00    Types: Cigarettes  . Smokeless tobacco: Never Used  Substance Use Topics  . Alcohol use: Yes    Comment: occasionally  . Drug use: No     Allergies   Patient has no known allergies.   Review of Systems Review of Systems  Constitutional: Negative for fever.  HENT: Positive for nosebleeds. Negative for congestion.   Respiratory: Negative for shortness of breath.   Cardiovascular: Negative for chest pain.  Gastrointestinal: Negative for abdominal pain, nausea and vomiting.  Musculoskeletal: Negative for back pain.  Neurological: Positive for headaches. Negative for dizziness and weakness.  All other systems reviewed and are negative.    Physical Exam Updated Vital Signs BP (!) 143/91 (BP Location: Left Arm)   Pulse 93   Temp 98.2 F (36.8 C) (Oral)   Resp 18   Ht  (1.727 m)   Wt 83.9 kg (185 lb)   SpO2 97%   BMI 28.13 kg/m   Physical Exam  Constitutional: He is oriented to person, place, and time. He appears  well-developed and well-nourished. No distress.  HENT:  Head: Normocephalic and atraumatic.  Slight friability noted over the left nasal septum, no clots or active bleeding noted  Eyes: Pupils are equal, round, and reactive to light. EOM are normal.  Neck: Normal range of motion. Neck supple.  No meningismus  Cardiovascular: Normal rate, regular rhythm and normal heart sounds.  No murmur heard. Pulmonary/Chest: Effort normal and breath sounds normal. No respiratory distress. He has no wheezes.  Abdominal: Soft. Bowel sounds are normal. There is no tenderness. There is no rebound.  Musculoskeletal: He exhibits no edema.   Lymphadenopathy:    He has no cervical adenopathy.  Neurological: He is alert and oriented to person, place, and time.  Cranial nerves II through XII intact, fluent speech, 5 out of 5 strength in all 4 extremities, no dysmetria to finger-nose-finger  Skin: Skin is warm and dry.  Psychiatric: He has a normal mood and affect.  Nursing note and vitals reviewed.    ED Treatments / Results  Labs (all labs ordered are listed, but only abnormal results are displayed) Labs Reviewed - No data to display  EKG None  Radiology No results found.  Procedures Procedures (including critical care time)  Medications Ordered in ED Medications  ibuprofen (ADVIL,MOTRIN) tablet 400 mg (400 mg Oral Given 11/20/17 0710)  oxymetazoline (AFRIN) 0.05 % nasal spray 1 spray (1 spray Each Nare Given 11/20/17 0709)     Initial Impression / Assessment and Plan / ED Course  I have reviewed the triage vital signs and the nursing notes.  Pertinent labs & imaging results that were available during my care of the patient were reviewed by me and considered in my medical decision making (see chart for details).     Patient presents with headache and nosebleed.  Symptoms largely resolved prior to evaluation.  Feels these may be related to blood pressure.  He has not taken blood pressure medications in over 1 month.  He is nontoxic-appearing on exam.  Vital signs notable for blood pressure of 143/91.  His neurologic exam is nonfocal.  No active bleeding.  Patient and I had a long discussion.  Regarding his blood pressure, he is slightly elevated here.  I have reviewed his chart.  He is only ever had mildly elevated numbers.  Feel that he would best be served by primary physician to monitor his blood pressures and prescribed medications in a setting when he can be followed.  Regarding his headache and nosebleed.  Headache without red flags.  Doubt subarachnoid hemorrhage.  No indication of meningitis.  Headache self  resolved mostly.  He was given ibuprofen.  Patient reports working in a dry environment.  Recommend nasal saline and limited Afrin.  No active bleeding at this time.  [facility]  He was given strict return precautions.  After history, exam, and medical workup I feel the patient has been appropriately medically screened and is safe for discharge home. Pertinent diagnoses were discussed with the patient. Patient was given return precautions.   Final Clinical Impressions(s) / ED Diagnoses   Final diagnoses:  Acute non intractable tension-type headache  Epistaxis    ED Discharge Orders    None       Shon Baton, MD 11/20/17 [id number]

## 2023-09-03 ENCOUNTER — Encounter (HOSPITAL_COMMUNITY): Payer: Self-pay

## 2023-09-03 ENCOUNTER — Emergency Department (HOSPITAL_COMMUNITY)

## 2023-09-03 ENCOUNTER — Emergency Department (HOSPITAL_COMMUNITY)
Admission: EM | Admit: 2023-09-03 | Discharge: 2023-09-03 | Disposition: A | Discharge status: Home / Self Care | Attending: Emergency Medicine | Admitting: Emergency Medicine

## 2023-09-03 ENCOUNTER — Other Ambulatory Visit: Payer: Self-pay

## 2023-09-03 DIAGNOSIS — M5440 Lumbago with sciatica, unspecified side: Secondary | ICD-10-CM | POA: Insufficient documentation

## 2023-09-03 DIAGNOSIS — I1A0 Resistant hypertension: Secondary | ICD-10-CM | POA: Diagnosis not present

## 2023-09-03 DIAGNOSIS — M545 Low back pain, unspecified: Secondary | ICD-10-CM | POA: Diagnosis present

## 2023-09-03 LAB — CBC WITH DIFFERENTIAL/PLATELET
Abs Immature Granulocytes: 0.03 10*3/uL (ref 0.00–0.07)
Basophils Absolute: 0 10*3/uL (ref 0.0–0.1)
Basophils Relative: 0 %
Eosinophils Absolute: 0.2 10*3/uL (ref 0.0–0.5)
Eosinophils Relative: 2 %
HCT: 43.1 % (ref 39.0–52.0)
Hemoglobin: 14.2 g/dL (ref 13.0–17.0)
Immature Granulocytes: 0 %
Lymphocytes Relative: 27 %
Lymphs Abs: 2.3 10*3/uL (ref 0.7–4.0)
MCH: 29.6 pg (ref 26.0–34.0)
MCHC: 32.9 g/dL (ref 30.0–36.0)
MCV: 89.8 fL (ref 80.0–100.0)
Monocytes Absolute: 0.8 10*3/uL (ref 0.1–1.0)
Monocytes Relative: 9 %
Neutro Abs: 5.3 10*3/uL (ref 1.7–7.7)
Neutrophils Relative %: 62 %
Platelets: 267 10*3/uL (ref 150–400)
RBC: 4.8 MIL/uL (ref 4.22–5.81)
RDW: 13.8 % (ref 11.5–15.5)
WBC: 8.7 10*3/uL (ref 4.0–10.5)
nRBC: 0 % (ref 0.0–0.2)

## 2023-09-03 LAB — BASIC METABOLIC PANEL
Anion gap: 10 (ref 5–15)
BUN: 18 mg/dL (ref 6–20)
CO2: 21 mmol/L — ABNORMAL LOW (ref 22–32)
Calcium: 8.9 mg/dL (ref 8.9–10.3)
Chloride: 107 mmol/L (ref 98–111)
Creatinine, Ser: 1.05 mg/dL (ref 0.61–1.24)
GFR, Estimated: >60 mL/min (ref >60)
Glucose, Bld: 98 mg/dL (ref 70–99)
Potassium: 3.7 mmol/L (ref 3.5–5.1)
Sodium: 138 mmol/L (ref 135–145)

## 2023-09-03 MED ORDER — TRAMADOL HCL 50 MG PO TABS: 50.0000 mg | ORAL_TABLET | Freq: Four times a day (QID) | ORAL | 0 refills | Status: DC | PRN

## 2023-09-03 MED ORDER — KETOROLAC TROMETHAMINE 30 MG/ML IJ SOLN
30.0000 mg | Freq: Once | INTRAMUSCULAR | Status: AC
  Administered 2023-09-03: 30 mg via INTRAMUSCULAR
  Filled 2023-09-03: qty 1

## 2023-09-03 MED ORDER — AMLODIPINE BESYLATE 5 MG PO TABS: 5.0000 mg | ORAL_TABLET | Freq: Every day | ORAL | 3 refills | Status: DC
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
## 2023-09-03 NOTE — Discharge Instructions (Addendum)
 Seen in the emergency department for low back pain and elevated blood pressure.  We are starting you on a blood pressure medication and some medication for pain.  You can also try Tylenol and ibuprofen.  We have given you contact information to establish care with a primary care doctor tomorrow to help you manage your blood pressure and your back problems.  Return to the emergency department if any worsening or concerning symptoms
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
## 2023-11-06 ENCOUNTER — Ambulatory Visit

## 2023-11-06 VITALS — BP 125/83 | HR 62 | Ht 68.0 in | Wt 192.1 lb

## 2023-11-06 DIAGNOSIS — Z23 Encounter for immunization: Secondary | ICD-10-CM

## 2023-11-06 DIAGNOSIS — I10 Essential (primary) hypertension: Secondary | ICD-10-CM | POA: Diagnosis not present

## 2023-11-06 DIAGNOSIS — G8929 Other chronic pain: Secondary | ICD-10-CM | POA: Diagnosis not present

## 2023-11-06 DIAGNOSIS — N529 Male erectile dysfunction, unspecified: Secondary | ICD-10-CM | POA: Diagnosis not present

## 2023-11-06 DIAGNOSIS — M5441 Lumbago with sciatica, right side: Secondary | ICD-10-CM

## 2023-11-06 MED ORDER — METHOCARBAMOL 750 MG PO TABS: 750.0000 mg | ORAL_TABLET | Freq: Four times a day (QID) | ORAL | 5 refills | Status: AC

## 2023-11-06 MED ORDER — SILDENAFIL CITRATE 100 MG PO TABS
50.0000 mg | ORAL_TABLET | Freq: Every day | ORAL | 8 refills | Status: DC | PRN
Start: 2023-11-06 — End: 2024-02-06

## 2023-11-06 MED ORDER — AMLODIPINE BESYLATE 10 MG PO TABS
10.0000 mg | ORAL_TABLET | Freq: Every day | ORAL | 1 refills | Status: DC
Start: 2023-11-06 — End: 2024-02-06

## 2023-11-06 NOTE — Progress Notes (Signed)
 New Patient Office Visit  Subjective    Patient ID: Blake Valentine, male    DOB: 09-22-1960  Age: 63 y.o. MRN: [id number]  CC:  Chief Complaint  Patient presents with   Establish Care    Pt here to establish care    HPI MACSEN FEIERTAG presents to establish care Establish care after recently being seen in the ER on 09/03/2023 for low back pain.   Outpatient Encounter Medications as of 11/06/2023  Medication Sig   amLODipine  (NORVASC ) 10 MG tablet Take 1 tablet (10 mg total) by mouth daily.   clindamycin  (CLEOCIN ) 300 MG capsule Take 1 capsule (300 mg total) by mouth 4 (four) times daily. X 7 days   ibuprofen  (ADVIL ,MOTRIN ) 800 MG tablet Take 1 tablet (800 mg total) by mouth 3 (three) times daily.   methocarbamol (ROBAXIN-750) 750 MG tablet Take 1 tablet (750 mg total) by mouth 4 (four) times daily.   sildenafil (VIAGRA) 100 MG tablet Take 0.5-1 tablets (50-100 mg total) by mouth daily as needed for erectile dysfunction.   traMADol  (ULTRAM ) 50 MG tablet Take 1 tablet (50 mg total) by mouth every 6 (six) hours as needed.   [DISCONTINUED] amLODipine  (NORVASC ) 5 MG tablet Take 1 tablet (5 mg total) by mouth daily.   No facility-administered encounter medications on file as of 11/06/2023.    Past Medical History:  Diagnosis Date   Hypertension     History reviewed. No pertinent surgical history.  Family History  Problem Relation Age of Onset   Diabetes Mother    Cancer Father    Diabetes Other     Social History   Socioeconomic History   Marital status: Single    Spouse name: Not on file   Number of children: Not on file   Years of education: Not on file   Highest education level: Not on file  Occupational History   Not on file  Tobacco Use   Smoking status: Some Days    Current packs/day: 1.00    Types: Cigarettes   Smokeless tobacco: Never  Vaping Use   Vaping status: Never Used  Substance and Sexual Activity   Alcohol use: Yes    Comment: occasionally    Drug use: No   Sexual activity: Not Currently  Other Topics Concern   Not on file  Social History Narrative   ** Merged History Encounter **       Social Drivers of Health   Financial Resource Strain: Not on file  Food Insecurity: No Food Insecurity (11/06/2023)   Hunger Vital Sign    Worried About Running Out of Food in the Last Year: Never true    Ran Out of Food in the Last Year: Never true  Transportation Needs: No Transportation Needs (11/06/2023)   PRAPARE - Administrator, Civil Service (Medical): No    Lack of Transportation (Non-Medical): No  Physical Activity: Not on file  Stress: Not on file  Social Connections: Not on file  Intimate Partner Violence: Not At Risk (11/06/2023)   Humiliation, Afraid, Rape, and Kick questionnaire    Fear of Current or Ex-Partner: No    Emotionally Abused: No    Physically Abused: No    Sexually Abused: No    ROS      Objective    BP 125/83   Pulse 62   Ht 5\' 8"  (1.727 m)   Wt 192 lb 1.9 oz (87.1 kg)   SpO2 94%   BMI  29.21 kg/m   Physical Exam Vitals and nursing note reviewed.  Constitutional:      Appearance: Normal appearance.  Eyes:     Extraocular Movements: Extraocular movements intact.     Pupils: Pupils are equal, round, and reactive to light.  Cardiovascular:     Rate and Rhythm: Normal rate and regular rhythm.  Pulmonary:     Effort: Pulmonary effort is normal.     Breath sounds: Normal breath sounds.  Musculoskeletal:     Cervical back: Normal.     Thoracic back: Normal.     Lumbar back: Tenderness present. Decreased range of motion. Positive right straight leg raise test. Negative left straight leg raise test.  Neurological:     Mental Status: He is alert and oriented to person, place, and time.  Psychiatric:        Mood and Affect: Mood normal.        Thought Content: Thought content normal.         Assessment & Plan:   Problem List Items Addressed This Visit       Cardiovascular  and Mediastinum   Hypertension - Primary   Stable with amlodipine  10 mg.  Refill provided.        Relevant Medications   amLODipine  (NORVASC ) 10 MG tablet   sildenafil (VIAGRA) 100 MG tablet     Nervous and Auditory   Chronic right-sided low back pain with right-sided sciatica   Relevant Medications   methocarbamol (ROBAXIN-750) 750 MG tablet   Other Relevant Orders   Ambulatory referral to Orthopedic Surgery     Other   Erectile dysfunction   Relevant Medications   sildenafil (VIAGRA) 100 MG tablet   Other Visit Diagnoses       Need for Tdap vaccination         Need for Streptococcus pneumoniae vaccination       Relevant Orders   Pneumococcal conjugate vaccine 20-valent (Prevnar 20)     Need for shingles vaccine       Relevant Orders   Zoster Recombinant (Shingrix )     Encounter for immunization       Relevant Orders   Varicella-zoster vaccine IM (Completed)   Pneumococcal conjugate vaccine 20-valent (Completed)       Return in about 3 months (around 02/06/2024).   Alison Irvine, FNP

## 2023-11-06 NOTE — Assessment & Plan Note (Signed)
 Stable with amlodipine  10 mg.  Refill provided.

## 2023-12-06 ENCOUNTER — Ambulatory Visit: Admitting: Physician Assistant

## 2023-12-11 ENCOUNTER — Ambulatory Visit: Admitting: Physician Assistant

## 2024-01-10 ENCOUNTER — Encounter: Payer: Self-pay | Admitting: Physician Assistant

## 2024-01-10 ENCOUNTER — Other Ambulatory Visit: Payer: Self-pay

## 2024-01-10 ENCOUNTER — Ambulatory Visit: Admitting: Physician Assistant

## 2024-01-10 DIAGNOSIS — M545 Low back pain, unspecified: Secondary | ICD-10-CM

## 2024-01-10 DIAGNOSIS — G8929 Other chronic pain: Secondary | ICD-10-CM

## 2024-01-10 MED ORDER — METHYLPREDNISOLONE 4 MG PO TBPK: ORAL_TABLET | ORAL | 0 refills | Status: DC
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
## 2024-02-06 ENCOUNTER — Ambulatory Visit

## 2024-02-06 ENCOUNTER — Telehealth: Payer: Self-pay | Admitting: Pharmacy Technician

## 2024-02-06 ENCOUNTER — Other Ambulatory Visit (HOSPITAL_COMMUNITY): Payer: Self-pay

## 2024-02-06 VITALS — BP 122/86 | HR 101 | Ht 67.0 in | Wt 185.1 lb

## 2024-02-06 DIAGNOSIS — N529 Male erectile dysfunction, unspecified: Secondary | ICD-10-CM | POA: Diagnosis not present

## 2024-02-06 DIAGNOSIS — I10 Essential (primary) hypertension: Secondary | ICD-10-CM

## 2024-02-06 MED ORDER — AMLODIPINE BESYLATE 10 MG PO TABS
10.0000 mg | ORAL_TABLET | Freq: Every day | ORAL | 1 refills | Status: AC
Start: 2024-02-06 — End: ?

## 2024-02-06 MED ORDER — SILDENAFIL CITRATE 100 MG PO TABS: 50.0000 mg | ORAL_TABLET | Freq: Every day | ORAL | 8 refills | Status: AC | PRN

## 2024-02-06 NOTE — Progress Notes (Signed)
   Established Patient Office Visit  Subjective   Patient ID: Blake Valentine, male    DOB: 21-Jan-1961  Age: 63 y.o. MRN: [id number]  Chief Complaint  Patient presents with   Medical Management of Chronic Issues    3 month follow up     HPI  Patient Active Problem List   Diagnosis Date Noted   Hypertension 11/06/2023   Chronic right-sided low back pain with right-sided sciatica 11/06/2023   Erectile dysfunction 11/06/2023   Closed dislocation of acromioclavicular joint 06/19/2012    ROS    Objective:     BP 122/86   Pulse (!) 101   Ht 5' 7 (1.702 m)   Wt 185 lb 1.3 oz (84 kg)   SpO2 95%   BMI 28.99 kg/m  BP Readings from Last 3 Encounters:  02/06/24 122/86  11/06/23 125/83  09/03/23 121/76   Wt Readings from Last 3 Encounters:  02/06/24 185 lb 1.3 oz (84 kg)  11/06/23 192 lb 1.9 oz (87.1 kg)  09/03/23 185 lb 3 oz (84 kg)     Physical Exam Vitals and nursing note reviewed.  Constitutional:      Appearance: Normal appearance.  HENT:     Head: Normocephalic.  Eyes:     Extraocular Movements: Extraocular movements intact.     Pupils: Pupils are equal, round, and reactive to light.  Cardiovascular:     Rate and Rhythm: Normal rate and regular rhythm.  Pulmonary:     Effort: Pulmonary effort is normal.     Breath sounds: Normal breath sounds.  Musculoskeletal:     Cervical back: Normal range of motion and neck supple.  Neurological:     Mental Status: He is alert and oriented to person, place, and time.  Psychiatric:        Mood and Affect: Mood normal.        Thought Content: Thought content normal.      No results found for any visits on 02/06/24.    The ASCVD Risk score (Arnett DK, et al., 2019) failed to calculate for the following reasons:   Cannot find a previous HDL lab   Cannot find a previous total cholesterol lab    Assessment & Plan:   Problem List Items Addressed This Visit       Cardiovascular and Mediastinum    Hypertension - Primary   Stable with amlodipine  10 mg.  Refill provided.        Relevant Medications   amLODipine  (NORVASC ) 10 MG tablet   sildenafil  (VIAGRA ) 100 MG tablet     Other   Erectile dysfunction   Agree to add viagra  100 mg for prn use of ED.       Relevant Medications   sildenafil  (VIAGRA ) 100 MG tablet    Return in about 6 months (around 08/08/2024) for chronic follow-up with PCP.    Leita Longs, FNP

## 2024-02-06 NOTE — Telephone Encounter (Signed)
 Pharmacy Patient Advocate Encounter   Received notification from CoverMyMeds that prior authorization for Sildenafil  Citrate 100MG  tablets is required/requested.   Insurance verification completed.   The patient is insured through [employer] .   Per test claim: Drug is excluded from coverage. Pharmacy and patient is aware.

## 2024-02-07 ENCOUNTER — Ambulatory Visit (HOSPITAL_BASED_OUTPATIENT_CLINIC_OR_DEPARTMENT_OTHER): Admitting: Physician Assistant

## 2024-02-14 ENCOUNTER — Ambulatory Visit (INDEPENDENT_AMBULATORY_CARE_PROVIDER_SITE_OTHER): Admitting: Physician Assistant

## 2024-02-14 ENCOUNTER — Encounter (HOSPITAL_BASED_OUTPATIENT_CLINIC_OR_DEPARTMENT_OTHER): Payer: Self-pay | Admitting: Physician Assistant

## 2024-02-14 DIAGNOSIS — G8929 Other chronic pain: Secondary | ICD-10-CM | POA: Diagnosis not present

## 2024-02-14 DIAGNOSIS — M545 Low back pain, unspecified: Secondary | ICD-10-CM

## 2024-02-14 MED ORDER — TRAMADOL HCL 50 MG PO TABS: 50.0000 mg | ORAL_TABLET | Freq: Four times a day (QID) | ORAL | 0 refills | Status: DC | PRN

## 2024-02-14 MED ORDER — METHOCARBAMOL 500 MG PO TABS: 500.0000 mg | ORAL_TABLET | Freq: Four times a day (QID) | ORAL | 0 refills | Status: DC | PRN

## 2024-02-14 NOTE — Assessment & Plan Note (Signed)
 Stable with amlodipine  10 mg.  Refill provided.

## 2024-02-14 NOTE — Progress Notes (Signed)
 Office Visit Note   Patient: Blake Valentine           Date of Birth: 1961/06/12           MRN: [id number] Visit Date: 02/14/2024              Requested by: Bevely Doffing, FNP [phone] ,  [place] [address] PCP: Bevely Doffing, FNP  Chief Complaint  Patient presents with   Lower Back - Follow-up      HPI: Patient is a pleasant 63 year old gentleman who have seen in the past for left lower back pain.  His last visit I did try him on a dose of steroids he does not think this helped him very much at all.  He has taken some Robaxin  that seem to help him.  Denies any fever or chills.  Focus pain in the lower left flank.  Assessment & Plan: Visit Diagnoses:  1. Chronic bilateral low back pain without sciatica     Plan: He is tender even to light palpation over the left lower flank though I do not see any sign of infection.  He is neuro logically intact.  Will give him some small amount of tramadol  as well as some Robaxin .  He will need engage with physical therapy.  Also have concerns that this could be a presentation for kidney stones.  He has not had any hematuria or dysuria.  I have asked that he check with his primary care provider regarding this.  Will follow-up with me in a few weeks he has been given strict return precautions.  He does acknowledge that is a primary care and will follow-up with this as soon as possible  Follow-Up Instructions: Return in about 3 weeks (around 03/06/2024).   Ortho Exam  Patient is alert, oriented, no adenopathy, well-dressed, normal affect, normal respiratory effort. Examination of his lower back he has no real pain over the spine itself.  With palpation even lightly to the left flank he has pain but there is no redness or cellulitis.  He has good lower extremity strength.  No radiculopathy today    Imaging: No results found. No images are attached to the encounter.  Labs: No results found for: HGBA1C, ESRSEDRATE,  CRP, LABURIC, REPTSTATUS, GRAMSTAIN, CULT, LABORGA   Lab Results  Component Value Date   ALBUMIN 3.9 05/07/2015   ALBUMIN 3.8 04/29/2015    No results found for: MG No results found for: VD25OH  No results found for: PREALBUMIN    Latest Ref Rng & Units 09/03/2023   11:04 AM 10/18/2016   12:59 PM 05/07/2015   12:49 PM  CBC EXTENDED  WBC 4.0 - 10.5 K/uL 8.7  15.0  9.4   RBC 4.22 - 5.81 MIL/uL 4.80  4.75  5.06   Hemoglobin 13.0 - 17.0 g/dL 85.7  85.5  84.0   HCT 39.0 - 52.0 % 43.1  42.2  45.8   Platelets 150 - 400 K/uL 267  254  254   NEUT# 1.7 - 7.7 K/uL 5.3  11.4  5.4   Lymph# 0.7 - 4.0 K/uL 2.3  1.9  2.9      There is no height or weight on file to calculate BMI.  Orders:  Orders Placed This Encounter  Procedures   Ambulatory referral to Physical Therapy   Meds ordered this encounter  Medications   DISCONTD: traMADol  (ULTRAM ) 50 MG tablet    Sig: Take 1 tablet (50 mg total) by  mouth every 6 (six) hours as needed.    Dispense:  15 tablet    Refill:  0   DISCONTD: methocarbamol  (ROBAXIN ) 500 MG tablet    Sig: Take 1 tablet (500 mg total) by mouth every 6 (six) hours as needed for muscle spasms.    Dispense:  30 tablet    Refill:  0     Procedures: No procedures performed  Clinical Data: No additional findings.  ROS:  All other systems negative, except as noted in the HPI. Review of Systems  Objective: Vital Signs: There were no vitals taken for this visit.  Specialty Comments:  No specialty comments available.  PMFS History: Patient Active Problem List   Diagnosis Date Noted   Hypertension 11/06/2023   Chronic right-sided low back pain with right-sided sciatica 11/06/2023   Erectile dysfunction 11/06/2023   Closed dislocation of acromioclavicular joint 06/19/2012   Past Medical History:  Diagnosis Date   Hypertension     Family History  Problem Relation Age of Onset   Diabetes Mother    Cancer Father    Diabetes Other      History reviewed. No pertinent surgical history. Social History   Occupational History   Not on file  Tobacco Use   Smoking status: Some Days    Current packs/day: 1.00    Types: Cigarettes   Smokeless tobacco: Never  Vaping Use   Vaping status: Never Used  Substance and Sexual Activity   Alcohol use: Yes    Comment: occasionally   Drug use: No   Sexual activity: Not Currently

## 2024-02-14 NOTE — Assessment & Plan Note (Signed)
 Agree to add viagra  100 mg for prn use of ED.

## 2024-04-14 ENCOUNTER — Other Ambulatory Visit (HOSPITAL_BASED_OUTPATIENT_CLINIC_OR_DEPARTMENT_OTHER): Payer: Self-pay | Admitting: Physician Assistant

## 2024-05-05 ENCOUNTER — Encounter: Payer: Self-pay | Admitting: Radiology

## 2024-08-08 ENCOUNTER — Ambulatory Visit

## 2024-09-08 ENCOUNTER — Ambulatory Visit: Payer: Self-pay
